# Patient Record
Sex: Male | Born: 1935 | Race: White | Hispanic: No | Marital: Married | Smoking: Never smoker
Health system: Southern US, Community
[De-identification: ages and names within clinical notes are randomized; demographics above are authoritative.]

## PROBLEM LIST (undated history)

## (undated) DIAGNOSIS — K529 Noninfective gastroenteritis and colitis, unspecified: Secondary | ICD-10-CM

## (undated) DIAGNOSIS — K5792 Diverticulitis of intestine, part unspecified, without perforation or abscess without bleeding: Secondary | ICD-10-CM

## (undated) DIAGNOSIS — K649 Unspecified hemorrhoids: Secondary | ICD-10-CM

## (undated) DIAGNOSIS — D126 Benign neoplasm of colon, unspecified: Secondary | ICD-10-CM

## (undated) DIAGNOSIS — M1990 Unspecified osteoarthritis, unspecified site: Secondary | ICD-10-CM

## (undated) DIAGNOSIS — K4090 Unilateral inguinal hernia, without obstruction or gangrene, not specified as recurrent: Secondary | ICD-10-CM

## (undated) DIAGNOSIS — E785 Hyperlipidemia, unspecified: Secondary | ICD-10-CM

## (undated) DIAGNOSIS — G309 Alzheimer's disease, unspecified: Secondary | ICD-10-CM

## (undated) DIAGNOSIS — K625 Hemorrhage of anus and rectum: Secondary | ICD-10-CM

## (undated) DIAGNOSIS — I10 Essential (primary) hypertension: Secondary | ICD-10-CM

## (undated) DIAGNOSIS — I4891 Unspecified atrial fibrillation: Secondary | ICD-10-CM

## (undated) DIAGNOSIS — C61 Malignant neoplasm of prostate: Secondary | ICD-10-CM

## (undated) DIAGNOSIS — N200 Calculus of kidney: Secondary | ICD-10-CM

## (undated) DIAGNOSIS — F0280 Dementia in other diseases classified elsewhere without behavioral disturbance: Secondary | ICD-10-CM

## (undated) DIAGNOSIS — K429 Umbilical hernia without obstruction or gangrene: Secondary | ICD-10-CM

## (undated) HISTORY — PX: APPENDECTOMY: SHX54

## (undated) HISTORY — PX: OTHER SURGICAL HISTORY: SHX169

## (undated) HISTORY — PX: ROTATOR CUFF REPAIR: SHX139

## (undated) HISTORY — PX: REPLACEMENT TOTAL KNEE BILATERAL: SUR1225

## (undated) HISTORY — PX: INSERTION PROSTATE RADIATION SEED: SUR718

## (undated) HISTORY — PX: JOINT REPLACEMENT: SHX530

## (undated) HISTORY — PX: UMBILICAL HERNIA REPAIR: SHX2598

---

## 1999-10-02 ENCOUNTER — Ambulatory Visit (HOSPITAL_COMMUNITY): Admission: RE | Admit: 1999-10-02 | Discharge: 1999-10-02 | Payer: Self-pay | Admitting: Cardiology

## 2000-03-09 ENCOUNTER — Emergency Department (HOSPITAL_COMMUNITY): Admission: EM | Admit: 2000-03-09 | Discharge: 2000-03-09 | Payer: Self-pay | Admitting: Emergency Medicine

## 2000-03-09 ENCOUNTER — Encounter: Payer: Self-pay | Admitting: Emergency Medicine

## 2001-03-13 ENCOUNTER — Encounter: Admission: RE | Admit: 2001-03-13 | Discharge: 2001-06-11 | Payer: Self-pay | Admitting: Radiation Oncology

## 2001-05-09 ENCOUNTER — Ambulatory Visit (HOSPITAL_BASED_OUTPATIENT_CLINIC_OR_DEPARTMENT_OTHER): Admission: RE | Admit: 2001-05-09 | Discharge: 2001-05-09 | Payer: Self-pay | Admitting: Urology

## 2001-05-09 ENCOUNTER — Encounter: Payer: Self-pay | Admitting: Urology

## 2001-07-24 ENCOUNTER — Ambulatory Visit: Admission: RE | Admit: 2001-07-24 | Discharge: 2001-10-22 | Payer: Self-pay | Admitting: Radiation Oncology

## 2002-04-21 ENCOUNTER — Encounter: Admission: RE | Admit: 2002-04-21 | Discharge: 2002-04-21 | Payer: Self-pay | Admitting: Urology

## 2002-04-21 ENCOUNTER — Encounter: Payer: Self-pay | Admitting: Urology

## 2003-06-24 ENCOUNTER — Ambulatory Visit (HOSPITAL_BASED_OUTPATIENT_CLINIC_OR_DEPARTMENT_OTHER): Admission: RE | Admit: 2003-06-24 | Discharge: 2003-06-25 | Payer: Self-pay | Admitting: Specialist

## 2004-09-22 ENCOUNTER — Ambulatory Visit (HOSPITAL_COMMUNITY): Admission: RE | Admit: 2004-09-22 | Discharge: 2004-09-22 | Payer: Self-pay | Admitting: Urology

## 2004-09-22 ENCOUNTER — Ambulatory Visit (HOSPITAL_BASED_OUTPATIENT_CLINIC_OR_DEPARTMENT_OTHER): Admission: RE | Admit: 2004-09-22 | Discharge: 2004-09-22 | Payer: Self-pay | Admitting: Urology

## 2004-10-04 ENCOUNTER — Ambulatory Visit (HOSPITAL_BASED_OUTPATIENT_CLINIC_OR_DEPARTMENT_OTHER): Admission: RE | Admit: 2004-10-04 | Discharge: 2004-10-04 | Payer: Self-pay | Admitting: Urology

## 2007-05-12 ENCOUNTER — Encounter: Admission: RE | Admit: 2007-05-12 | Discharge: 2007-05-12 | Payer: Self-pay | Admitting: Specialist

## 2007-06-25 ENCOUNTER — Emergency Department (HOSPITAL_COMMUNITY): Admission: EM | Admit: 2007-06-25 | Discharge: 2007-06-25 | Payer: Self-pay | Admitting: Emergency Medicine

## 2007-07-09 ENCOUNTER — Encounter
Admission: RE | Admit: 2007-07-09 | Discharge: 2007-07-09 | Payer: Self-pay | Admitting: Physical Medicine and Rehabilitation

## 2007-08-19 ENCOUNTER — Encounter: Admission: RE | Admit: 2007-08-19 | Discharge: 2007-08-19 | Payer: Self-pay | Admitting: Vascular Surgery

## 2007-08-19 ENCOUNTER — Ambulatory Visit: Payer: Self-pay | Admitting: Vascular Surgery

## 2007-09-02 ENCOUNTER — Ambulatory Visit: Payer: Self-pay | Admitting: Internal Medicine

## 2007-10-07 ENCOUNTER — Ambulatory Visit: Payer: Self-pay | Admitting: Internal Medicine

## 2007-10-07 ENCOUNTER — Encounter: Payer: Self-pay | Admitting: Internal Medicine

## 2008-02-29 ENCOUNTER — Emergency Department (HOSPITAL_COMMUNITY): Admission: EM | Admit: 2008-02-29 | Discharge: 2008-02-29 | Payer: Self-pay | Admitting: Emergency Medicine

## 2008-03-11 DIAGNOSIS — I10 Essential (primary) hypertension: Secondary | ICD-10-CM | POA: Insufficient documentation

## 2008-03-11 DIAGNOSIS — F0280 Dementia in other diseases classified elsewhere without behavioral disturbance: Secondary | ICD-10-CM

## 2008-03-11 DIAGNOSIS — K625 Hemorrhage of anus and rectum: Secondary | ICD-10-CM

## 2008-03-11 DIAGNOSIS — K649 Unspecified hemorrhoids: Secondary | ICD-10-CM | POA: Insufficient documentation

## 2008-03-11 DIAGNOSIS — N200 Calculus of kidney: Secondary | ICD-10-CM

## 2008-03-11 DIAGNOSIS — D126 Benign neoplasm of colon, unspecified: Secondary | ICD-10-CM

## 2008-03-11 DIAGNOSIS — K5730 Diverticulosis of large intestine without perforation or abscess without bleeding: Secondary | ICD-10-CM | POA: Insufficient documentation

## 2008-03-11 DIAGNOSIS — E785 Hyperlipidemia, unspecified: Secondary | ICD-10-CM

## 2008-03-11 DIAGNOSIS — K429 Umbilical hernia without obstruction or gangrene: Secondary | ICD-10-CM | POA: Insufficient documentation

## 2008-03-11 DIAGNOSIS — K4090 Unilateral inguinal hernia, without obstruction or gangrene, not specified as recurrent: Secondary | ICD-10-CM | POA: Insufficient documentation

## 2008-03-11 DIAGNOSIS — K5289 Other specified noninfective gastroenteritis and colitis: Secondary | ICD-10-CM

## 2008-03-11 DIAGNOSIS — I4891 Unspecified atrial fibrillation: Secondary | ICD-10-CM

## 2008-03-11 DIAGNOSIS — M129 Arthropathy, unspecified: Secondary | ICD-10-CM | POA: Insufficient documentation

## 2008-03-11 DIAGNOSIS — G309 Alzheimer's disease, unspecified: Secondary | ICD-10-CM

## 2009-08-16 ENCOUNTER — Ambulatory Visit: Payer: Self-pay | Admitting: Vascular Surgery

## 2010-08-09 ENCOUNTER — Encounter: Payer: Self-pay | Admitting: Internal Medicine

## 2011-01-14 ENCOUNTER — Encounter: Payer: Self-pay | Admitting: Vascular Surgery

## 2011-01-25 NOTE — Letter (Signed)
Summary: Colonoscopy-Changed to Office Visit Letter  [facility]  [address] [place], [place] [address]   Phone: [phone]  Fax: [phone]      August 09, 2010 MRN: [id number]   [facility] [address] [place], [place]  [address]   Dear Mr. Sedalia Steven [name],   According to our records, it is time for you to schedule a Colonoscopy. However, after reviewing your medical record, I feel that an office visit would be most appropriate to more completely evaluate you and determine your need for a repeat procedure.  Please call [phone] (option #2) at your convenience to schedule an office visit. If you have any questions, concerns, or feel that this letter is in error, we would appreciate your call.   Sincerely,  Wilhemina Bonito. Marina Goodell, M.D.  [facility] [phone]

## 2011-05-08 NOTE — Assessment & Plan Note (Signed)
OFFICE VISIT   [name], Steven W  DOB:  03-Dec-1928                                       08/19/2007  ZOXWR#:[id number]   The patient returns for followup regarding his small abdominal aortic  aneurysm which was discovered by Dr. Darvin Neighbours a few years ago when the  patient had a kidney stone problem.  At that time his aneurysm measured  4.5 cm in maximum diameter.  A duplex scan in the office last year  revealed it to be only 3.6 cm.  Today, a CT scan was obtained which  revealed the diameter to be 3.6 cm in the infrarenal aorta.  He has not  had any new abdominal or back symptoms.  He denies any hemiparesis,  aphasia, amaurosis fugax, diplopia, blurred vision or syncope.  He also  does not have claudication symptoms in his legs, but is limited in  walking because of bad knees and previous knee replacement surgery  bilaterally.  He denies any cardiac symptoms such as chest pain or  dyspnea on exertion.  He does take chronic Coumadin for atrial  fibrillation.   PHYSICAL EXAMINATION:  Blood pressure 138/93, heart rate is 92,  respirations are 18.  His carotid pulses are 3+ with no audible bruits.  Chest was clear to auscultation.  His abdomen is obese with no palpable  masses noted.  He has 3+ femoral and dorsalis pedis pulses palpable  bilaterally.   I reviewed the CT scan today and discussed the findings with him.  We  will follow this aneurysm every 2 years because of its small size, and  on the next followup he will get a duplex scan to look at this.   Quita Skye Hart Rochester, M.D.  Electronically Signed   JDL/MEDQ  D:  08/19/2007  T:  08/20/2007  Job:  [id number]   cc:   Geoffry Paradise, M.D.  Ronald L. Earlene Plater, M.D.

## 2011-05-08 NOTE — Assessment & Plan Note (Signed)
OFFICE VISIT   [name], Steven W  DOB:  1928-09-13                                       08/16/2009  EAVWU#:[id number]   The patient returns today for followup regarding his infrarenal  abdominal aortic aneurysm and also for evaluation of right leg edema and  varicosities.  The aneurysm has been followed by me and was last seen 2  years ago and initially thought to be 4.5 cm by CT scan but smaller than  that on our studies.  Repeat duplex scan today reveals the aneurysm to  only be 3.6 x 3.6 cm in maximum diameter.  He has had no abdominal or  back symptoms.  He does, however, have severe swelling in his right leg  below the knee and his wife has noted significant darkening of the skin  in the lower third of the right leg.  He has had painful varicosities in  the right calf for many years and he has been having increasing swelling  below the knee as well as the darkening of the skin.  There is no  history of stasis ulcers, bleeding or other complications.  He does not  wear elastic compression stockings on a regular basis nor elevate the  leg.  He cannot take ibuprofen because he is on Coumadin for atrial  fibrillation.  He has no symptoms in the contralateral left leg.   PHYSICAL EXAM:  His right lower extremity has severe venous  insufficiency with prominent varicosities in the distal thigh and the  medial calf over the greater saphenous system with 4-5 cm of discrepancy  in the circumference of the right calf on the right compared to the  left.  He has hyperpigmentation in the lower third of the right leg with  no active ulcers but thickening of the skin and one to two plus edema in  the ankle.  Left leg is free of edema.   Venous duplex exam reveals some reflux in the deep system of the right  leg as well as no evidence of any deep venous obstruction.  He does have  severe reflux at the right saphenofemoral junction throughout the right  great saphenous  vein which is 1 cm in size down to the knee level.   I think the significant amount of the edema is originating from the  gross reflux in the right great saphenous vein and I have fit him for 20  - 30 mm gradient elastic compression stockings (long-leg) to wear as  well as instructed him in elevation of the leg and taking Tylenol for  pain.  He will return in 3 months and if there has been no improvement I  think he would benefit from laser ablation of his right great saphenous  vein with multiple stab phlebectomies in the right calf.  He will return  in 3 months for followup.  He will also return in 2 years for protocol  followup of his abdominal aortic aneurysm.   Quita Skye Hart Rochester, M.D.  Electronically Signed   JDL/MEDQ  D:  08/16/2009  T:  08/17/2009  Job:  [id number]

## 2011-05-08 NOTE — Procedures (Signed)
DUPLEX ULTRASOUND OF ABDOMINAL AORTA   INDICATION:  Followup abdominal aortic aneurysm.   HISTORY:  Diabetes:  no  Cardiac:  Atrial fibrillation  Hypertension:  yes  Smoking:  no  Connective Tissue Disorder:  Family History:  no  Previous Surgery:  no   DUPLEX EXAM:         AP (cm)                   TRANSVERSE (cm)  Proximal             2.83 cm                   2.44 cm  Mid                  3.59 cm                   3.65 cm  Distal               3.14 cm                   3.35 cm  Right Iliac          1.35 cm                     cm  Left Iliac           1.29 cm                     cm   PREVIOUS:  Date: 08/19/2007 (CT)  AP:  3.6  TRANSVERSE:   IMPRESSION:  Stable abdominal aortic aneurysm measuring 3.59 cm X 3.65  cm.   ___________________________________________  Quita Skye Hart Rochester, M.D.   AS/MEDQ  D:  08/16/2009  T:  08/16/2009  Job:  [id number]

## 2011-05-08 NOTE — Assessment & Plan Note (Signed)
[name]                         GASTROENTEROLOGY OFFICE NOTE   NAME:Steven [name], Steven [name]                        MRN:          [id number]  DATE:09/02/2007                            DOB:          Mar 20, 1928    REASON FOR EVALUATION:  Surveillance colonoscopy.   HISTORY:  This is a 77 year old white male with a history of  hypertension, chronic atrial fibrillation, for which he is on Coumadin,  hyperlipidemia, prostate cancer, status post seed implantation,  radiation proctitis, kidney stones, and adenomatous colon polyps.  Patient was evaluated in May, 2005 for rectal bleeding.  A complete  colonoscopy on June 06, 2004 revealed radiation proctitis, internal  hemorrhoids, and a 10 mm pedunculated transverse colon polyp, which was  removed and found to be an adenoma.  He also had left-sided  diverticulosis.  Followup in three years recommended.  Patient reports  that his interval medical health has been stable.  He is accompanied  today by his wife, who corroborates such.  He has rarely had problems  with rectal bleeding.  No interval cardiac issues.  Recent evaluation of  a small known abdominal aortic aneurysm is also stable at 3.6 cm in  maximal diameter.   PAST MEDICAL HISTORY:  As above.  1. Prostate seed implantation.  2. Inguinal hernia repair.  3. Hemorrhoidectomy.  4. Appendectomy.  5. Bilateral knee replacement.  6. Mastoid surgery.  7. Umbilical hernia repair.   ALLERGIES:  No known drug allergies.   CURRENT MEDICATIONS:  1. Atenolol 50 mg daily.  2. Lanoxin 0.25 mg daily.  3. Aricept 10 mg daily.  4. Mobic 7.5 mg daily.  5. Multivitamin.  6. Coumadin 5 mg five days per week, 2.5 mg one day per week.  7. Calcium with vitamin D.  8. Namenda.  9. Simvastatin 80 mg daily.   FAMILY HISTORY:  Mother with colon polyps.   SOCIAL HISTORY:  Married with three children.  A master's degree.  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   Does not smoke.  Does use  alcohol.   REVIEW OF SYSTEMS:  Negative.   PHYSICAL EXAMINATION:  A well-appearing male in no acute distress.  Blood pressure is 120/80.  Heart rate 72.  Weight is 287.6 pounds.  He  is 6 feet 1 inches in height.  HEENT:  Sclerae are anicteric.  Conjunctivae are pink.  Oral mucosa  intact.  No adenopathy.  LUNGS:  Clear.  HEART:  Irregularly irregular.  ABDOMEN:  Soft without tenderness, mass, or hernia.  A large midline  incision with a big scar.  EXTREMITIES:  Without edema.   IMPRESSION:  1. This is a 77 year old gentleman with multiple medical problems, who      presents regarding surveillance colonoscopy.  Despite his advanced      age and medical problems, he is clinically stable and active.  He      is an appropriate candidate for surveillance without      contraindication.  The biggest issue is management of his      anticoagulation.  As previous, we will recommend  holding Coumadin      three days prior to the exam.  At that time, I will ask him to      start a baby aspirin for overlap purposes.  The nature of      colonoscopy as well as the risks, benefits and alternatives were      reviewed in detail.  He understood and agreed to proceed.  2. History of radiate proctitis.  3. Multiple general medical problems.     Wilhemina Bonito. Marina Goodell, MD  Electronically Signed    JNP/MedQ  DD: 09/02/2007  DT: 09/03/2007  Job #: [id number]   cc:   Geoffry Paradise, M.D.

## 2011-05-08 NOTE — Procedures (Signed)
LOWER EXTREMITY VENOUS REFLUX EXAM   INDICATION:  Right lower extremity varicose veins with pain.   EXAM:  Using color-flow imaging and pulse Doppler spectral analysis, the  right common femoral, superficial femoral, popliteal, posterior tibial,  greater and lesser saphenous veins are evaluated.  There is evidence  suggesting deep venous insufficiency in the right lower extremity.   The right saphenofemoral junction is not competent.  The right GSV is  not competent with the caliber as described below.   The right proximal short saphenous vein demonstrates competency.   GSV Diameter (used if found to be incompetent only)                                            Right    Left  Proximal Greater Saphenous Vein           1.11 cm  cm  Proximal-to-mid-thigh                     cm       cm  Mid thigh                                 1.02 cm  cm  Mid-distal thigh                          cm       cm  Distal thigh                              0.93 cm  cm  Knee                                      1.23 cm  cm   IMPRESSION:  1. Right greater saphenous vein reflux is identified with the caliber      ranging from 0.93 cm to 1.23 cm knee to groin.  2. The right greater saphenous vein is not aneurysmal.  3. The right greater saphenous vein is not tortuous.  4. The deep venous system is not competent.  5. The right lesser saphenous vein is competent.  6. 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       ___________________________________________  Steven [name], M.D.   AS/MEDQ  D:  08/16/2009  T:  08/16/2009  Job:  [id number]

## 2011-05-11 NOTE — Op Note (Signed)
NAME:  Steven [name], Steven [name]                           ACCOUNT NO.:  [id number]   MEDICAL RECORD NO.:  [id number]                   PATIENT TYPE:  AMB   LOCATION:  DSC                                  FACILITY:  [facility]   PHYSICIAN:  Erasmo Leventhal, M.D.         DATE OF BIRTH:  23-Mar-1928   DATE OF PROCEDURE:  06/24/2003  DATE OF DISCHARGE:                                 OPERATIVE REPORT   PREOPERATIVE DIAGNOSES:  Right shoulder rotator cuff tear, chronic  impingement syndrome with symptomatic degenerative acromioclavicular joint.   POSTOPERATIVE DIAGNOSES:  1. Right shoulder degenerative tearing of glenoid labrum.  2. Full-thickness rotator cuff tear.  3. Chronic rotator cuff impingement syndrome.  4. Symptomatic degenerative acromioclavicular joint.   PROCEDURES:  1. Right shoulder arthroscopic evaluation of glenohumeral joint with intra-     articular debridement of labral tears.  2. Arthroscopic subacromial decompression.  3. Arthroscopic distal clavicle resection, Mumford procedure.  4. Mini-open rotator cuff repair.   SURGEON:  Erasmo Leventhal, M.D.   ASSISTANT:  Jaquelyn Bitter. Chabon, P.A.-C.   ANESTHESIA:  Preoperative interscalene block, general.   ESTIMATED BLOOD LOSS:  Less than 10 mL.   DRAINS:  None.   COMPLICATIONS:  None.   DISPOSITION:  To PACU stable.   OPERATIVE DETAILS:  The patient and family were counseled in the holding  area.  The correct side was identified.  IV was started.  Taken to the OR,  placed in the supine position under general anesthesia and placed into a  modified beach chair position.  IV Ancef was given 1 g, as he has a history  of PENICILLIN allergy, only a rash.  He had no problems with IV Ancef during  the surgical procedure.   He was prepped with Duraprep and all was draped in a sterile fashion.  A  standard posterior portal was created and arthroscope placed through the  glenohumeral joint.  Diagnostic arthroscopy revealed  the articular cartilage  to be healthy, the shoulder was stable.  The biceps was intact, but there  were several degenerative tears of the glenoid labrum, which were unstable.  The rotator cuff showed a 2 cm full-thickness tear.  The anterolateral  portal was created staying proximal to the axillary nerve.  A motorized  shaver was then introduced through this tear into the shoulder joint itself  and the labral tears were debrided intra-articular back to a healthy rim.   Arthroscope now placed through the subacromial region, where a very thick  subacromial bursa was encountered.  Utilizing the shaver, a subacromial  bursectomy was performed.  Rotator cuff edges were then freshened back to  healthy tissue and mobilized appropriately.  The rotator cuff insertion site  was then prepared with a bur down to bleeding bone.   The Arthrex system was utilized to release the periosteum and the CA  ligament.  Hemostasis was obtained.  The bur was then  placed posteriorly and  an anterior inferior acromioplasty was performed converting him to a type 1  acromion morphology.   Attention directed to the distal clavicle.  The [facility] joint was osteoarthritic.  An accessory anterior portal was made.  At this time a bur was then placed  and the lateral 1.5 cm of the clavicle was removed circumferentially,  leaving the superior and posterior capsule intact.  The clavicle was  palpated and found to be stable.  Arthroscopic debris was removed and a  meticulous hemostasis was obtained.   At this time arthroscopic equipment was removed.  The previous superior  lateral portal was then extended anterior and posterior in the skin and  subcutaneous tissue.  The veins were electrocoagulated.  The deltoid was  split for a total of 2.5 cm, protecting the axillary nerve.  It was not  detached.  It was open, and then the rotator cuff was repaired back to its  insertion site with an Arthrex bioabsorbable anchor placed into  the anatomic  insertion site.  This gave an excellent repair.  The arm was put through a  range of motion.  The rotator cuff was nicely repaired.  There was no undue  tension, these sutures were not catching, and it was repaired in an  excellent fashion.  All wounds were copiously irrigated at this point in  time, and meticulous hemostasis was performed.   The deltoid fascia was closed with Vicryl, subcu Vicryl, portals and skin  closed with nylon suture.  After conferring with anesthesia, another 9 mL of  0.25% Marcaine was placed in the wound edges for pain control.  A sterile  compressive dressing was applied to the shoulder.  He was then awakened and  he was taken from the operating room to the PACU in stable condition in a  shoulder abduction pillow.  There were no complications.  Sponge and needle  count were correct.   PLAN:  He will be kept overnight for observation.                                               Erasmo Leventhal, M.D.    RAC/MEDQ  D:  06/24/2003  T:  06/25/2003  Job:  [id number]   cc:   Cassell Clement, M.D.  [address], [address]  [place]  [place] [address]  Fax: [phone]

## 2011-05-11 NOTE — Consult Note (Signed)
[place]  Patient:    Steven [name], Steven [name]                        MRN: [id number] Proc. Date: 03/09/00 Adm. Date:  [id number] Attending:  Otilio Connors Iv CC:         Dr. Byrd Hesselbach, [facility], Dept. of Genera             Richard A. Jacky Kindle, M.D.                          Consultation Report  REFERRED BY:  Marnee Guarneri, M.D.  REASON FOR CONSULTATION:  Postoperative abdominal wound problems.  HISTORY OF PRESENT ILLNESS:  Mr. Bradish is a 77 year old male who is status post repair of recurrent ventral incisional hernia with mesh February 26, 2000, at [facility] by Dr. Byrd Hesselbach.  He was discharged to home on March 04, 2000, and did well until yesterday when he began having fevers to 101 degrees.  Today, he  noted redness around the incision and began having bleeding.  He called the [facility], and they gave him the choice of coming in there, although because he was bleeding, they thought it might be important for him to be seen at the closest facility which is [facility].  Since he has been here, he has had a dressing placed on the wound, and the bleeding has stopped.  PAST MEDICAL HISTORY: 1. Atrial fibrillation. 2. Hypercholesterolemia. 3. Recurrent ventral incisional hernia. 4. Degenerative joint disease.  PREVIOUS OPERATIONS:  Bilateral knee replacement; exploratory laparotomy for trauma as a child; repair of ventral hernia x 3.  ALLERGIES:  None known.  MEDICATIONS:  Digoxin, atenolol, Coumadin.  SOCIAL HISTORY:  He denies tobacco use.  He is married.  He occasionally has an  alcoholic beverage.  REVIEW OF SYSTEMS:  He denies any current cough, nausea, or vomiting.  PHYSICAL EXAMINATION:  GENERAL:  He is a well-developed, well-nourished male in no acute distress.  VITAL SIGNS:  On admission to the emergency department, he was afebrile, but he  feels  febrile now.  Blood pressure 102/54, pulse 88.  ABDOMEN:  Soft, slightly distended.  There is a midline scare and peri-incisional erythema at the mid portion with purulent drainage coming from the wound.  LABORATORY DATA:  White blood cell count is 17,800.  INR 2.5, PTT 48.  CT scan demonstrates an abdominal wall fluid collection with some pockets of air in it.  There are also some lesions in his kidney that are ill defined.  IMPRESSION:  Postoperative deep wound infection status post recurrent ventral hernia with mesh.  Apparently he states it was a complex repair, and I do not have the records from [facility].  He also has chronic atrial fibrillation and is on Coumadin therapy for that.  Finally, there are seborrheic lesions in is kidneys noted on CT scan, and it has been suggested to have an ultrasound performed in followup.  PLAN:  Transfer to [facility] where Dr. Beverely Pace has accepted him for Dr. Byrd Hesselbach.  IV Rocephin 1 g was already given.  He is agreeable to this. I did tell him that he may have to have further evaluation of his kidneys by ultrasound. DD:  03/09/00 TD:  03/10/00 Job: [id number]

## 2011-05-11 NOTE — Op Note (Signed)
[place]  Patient:    Steven [name], Steven [name]                        MRN: [id number] Proc. Date: 05/09/01 Adm. Date:  [id number] Disc. Date: [id number] Attending:  Nelma Rothman Iii                           Operative Report  PREOPERATIVE DIAGNOSIS:  Adenocarcinoma of the prostate.  PROCEDURE:  Transperineal implantation of iodine-125 seeds of the prostate and flexible cystourethroscopy.  SURGEON:  Lucrezia Starch. Ovidio Hanger, M.D.  ASSISTANT:  Wynn Banker, M.D.  ANESTHESIA:  General endotracheal anesthesia.  ESTIMATED BLOOD LOSS:  15 cc.  TUBE:  16 French Foley.  COMPLICATIONS:  None.  A total of 85 seeds were implanted with 23 needles at 0.450 millicuries per seed.  INDICATIONS:  Mr. Steven [name] is a very nice 77 year old white male who was found to have an elevated PSA and subsequently underwent transrectal ultrasound and biopsy which revealed a Vanessa Ralphs Score 6 which was 3+3 adenocarcinoma from the left apex, an area of PIN located in the left midprostate.  He has considered all options.  After understanding the risks, benefits, and alternatives, he has elected to proceed with seed implantation.  He has been properly simulated and properly informed.  DESCRIPTION OF PROCEDURE:  The patient was placed in the supine position. After proper general endotracheal anesthesia, he was placed in the dorsal lithotomy position and prepped and draped with Betadine in sterile fashion. A 16 French Foley catheter was inserted, inflated with 10 cc of contrast solution.  He was prepped after the transrectal ultrasound probe had been placed on the [facility] stabilization and localized preplanned coordinates at a reference plane of 2 cm from the base.  The physical and electronic grid were placed.  Two needles were placed in unused coordinates to facilitate three-point fixation and serial implantation of the prostate was performed.  A total of 85 seeds were  implanted with 23 needles at 0.450 millicuries per seed. Vicryl strands were utilized and they were placed in preplanned coordinates.  Seed implantation was facilitated utilizing both ultrasound and fluoroscopic simultaneously.  Following implantation we were comfortable with seed location, the transrectal ultrasound probe and a 16 French red rubber catheter which had been placed in the rectum to prevent flatus was removed. The wound was dressed sterilely.  The patient was placed in the supine position.  The Foley catheter was removed and scanned for seeds and there were none within it.  Flexocystourethroscopy was then performed with an Olympus flexible cystourethroscope.  He was noted to have mild trilobar hypertrophy, one trabeculation was noted.  The efflux of clear urine was noted from the normally placed ureteral orifices bilaterally and there were no seeds in the bladder or in the urethra.  The flexible cystourethroscope was visually removed. A fresh 16 French Foley catheter was inserted and the patient was taken to the recovery room stable. DD:  05/09/01 TD:  05/12/01 Job: [id number]

## 2011-05-11 NOTE — Op Note (Signed)
NAME:  Steven [name], Steven [name]                 ACCOUNT NO.:  [id number]   MEDICAL RECORD NO.:  [id number]          PATIENT TYPE:  AMB   LOCATION:  [facility]                         FACILITY:  [facility]   PHYSICIAN:  Ronald L. Earlene Plater, M.D.  DATE OF BIRTH:  09/17/1928   DATE OF PROCEDURE:  09/22/2004  DATE OF DISCHARGE:                                 OPERATIVE REPORT   PREOPERATIVE DIAGNOSIS:  Right urolithiasis.   POSTOPERATIVE DIAGNOSIS:  Right urolithiasis with right ureteral stricture.   PROCEDURE:  Cystoscopy with water dilatation of right ureteral stricture,  placement of right double J stent.   SURGEON:  Lucrezia Starch. Earlene Plater, M.D.   ANESTHESIA:  LMA.   ESTIMATED BLOOD LOSS:  Negligible.   TUBES:  A 26-cm 6-French Cook double J pigtail stent.   COMPLICATIONS:  No complications.   CONDITION:  To recovery room stable.   INDICATIONS FOR PROCEDURE:  [name] is a 77 year old pleasant white male who  presented originally with hematuria and flank pain x2. Subsequently with CT  scan he was found to have two 4.5-cm abdominal aortic aneurysm, 2-mm  nonobstructing stone of the left kidney, bilateral simple renal cysts, and  very minimally obstructing 3 to 4-mm stone within the right ureter at the  level of the sacrum. He was further more followed up by Dr. Hart Rochester who  thought he was stable to undergo procedure today, and the aneurysm is  stable. He did stop a Coumadin a week ago, and PT is normal.   PROCEDURE IN DETAIL:  The patient was placed in the supine position with  proper LMA anesthesia, placed in the dorsal lithotomy position, prepped and  draped with Betadine in a sterile fashion. Cystourethroscopy was performed  with a 22.5-French Olympus cystoscope. Bladder was carefully inspected and  noted to have Grade 1 to 2 trabeculation, mild trilobular hypertrophy, and  ureteral orifices were normal location, and clear urine was noted  bilaterally. Under direction vision and fluoroscopic guidance,  0.038-French  Teflon-coated guide wire was placed into the right renal pelvis. Then a  balloon dilator was placed into the right ureter, a  5-cm 4-mm balloon  ureteral balloon dilator, dilated initially to 10 atmospheres pressure and  then was redilated due to wasting from a ureteral stricture below the stone.  Maximum dilatation was 14 atmospheres of pressure. Despite these procedures,  ureteral stricture remained firm. It was felt at this time it was not safe  to proceed with ureteroscopy. Therefore, double J stent was placed in the  right ureter with visualization of curling in both right renal pelvis and  bladder. Placing a stent  would allow the ureter to autodilate, and we would repeat this procedure in  10 days with ureteroscopy at that point. The location of the stent was  confirmed fluoroscopy, bladder was drained, and endoscope was removed, and  patient was taken to recovery room stable.      JL/MEDQ  D:  09/22/2004  T:  09/22/2004  Job:  [id number]

## 2011-05-11 NOTE — Op Note (Signed)
NAME:  Steven [name], Steven [name]                 ACCOUNT NO.:  [id number]   MEDICAL RECORD NO.:  [id number]          PATIENT TYPE:  AMB   LOCATION:  [facility]                         FACILITY:  [facility]   PHYSICIAN:  Ronald L. Earlene Plater, M.D.  DATE OF BIRTH:  December 06, 1928   DATE OF PROCEDURE:  10/04/2004  DATE OF DISCHARGE:                                 OPERATIVE REPORT   PREOPERATIVE DIAGNOSIS:  Right distal ureteral stone.   POSTOPERATIVE DIAGNOSIS:  Right distal ureteral stone.   PROCEDURE PERFORMED:  1.  Cystourethroscopy.  2.  Removal of right double-J ureteral stent.  3.  Ureteroscopy.  4.  Ureteroscopic laser lithotripsy.  5.  Ureteroscopic stone removal.  6.  Placement of a right 6 x 26 double-J ureteral stent.   ATTENDING SURGEON:  Darvin Neighbours, MD   ASSISTANT:  Rhae Lerner, MD   ANESTHESIA:  General endotracheal anesthesia.   COMPLICATIONS:  None.   INDICATION FOR PROCEDURE:  [name] is a 77 year old white male, who has a  history of a right distal ureteral stone, status post placement of a double-  J ureteral stent.  [name] presents today for removal of his stone.   PROCEDURE IN DETAIL:  The patient was brought to the operating room.  Following induction of general endotracheal anesthesia, was placed in dorsal  lithotomy position and prepped and draped in the usual sterile fashion.  A  rigid cystoscope was subsequently introduced through the patient's urethra  and into the bladder.  Upon entering the bladder, the distal tip of the  previously placed right double-J ureteral stent was identified and  apprehended with flexible grasping forceps.  The stent was subsequently  pulled out through the urethra until the distal tip of the stent was present  at the urethral meatus.  A guidewire was subsequently passed through the  stent and up into the right renal pelvis under fluoroscopic guidance.  The  old ureteral stent was subsequently removed over the wire.  A semi-rigid  ureteroscope was then advanced through the patient's urethra and into the  bladder.  The ureteroscope was subsequently used to intubate the right  ureteral orifice and was advanced up into the distal half of the ureter at  which point the previously noted ureteral stone was identified.  An initial  attempt was made to remove the stone by apprehending it with a nitinol  basket and removing it intact.  The stone was, however, too large to pass  easily through the intramural ureter.  The stone was subsequently released  from the nitinol basket and the holmium laser used to fragment the stone  into multiple pieces.  These pieces were then subsequently removed using the  nitinol basket.  Once all stone fragments had been removed from the ureter,  a 6 x 26 double-J ureteral stent was placed back up into the right ureter  and its correct positioning confirmed fluoroscopically.  The tether was left  attached to the distal tip of the stent and brought through the patient's  urethra.  The remaining stone fragments within the bladder  were subsequently  irrigated out, bladder emptied, and the cystoscope  removed.  The tether was then taped to the glans penis, and the case was  ended.  The patient tolerated the procedure well, and there were no  complications.  Please note that Dr. Darvin Neighbours was present for the entire  case and participated in all aspects of the procedure.      JP/MEDQ  D:  10/04/2004  T:  10/04/2004  Job:  [id number]

## 2011-07-23 ENCOUNTER — Encounter (INDEPENDENT_AMBULATORY_CARE_PROVIDER_SITE_OTHER): Payer: Medicare Other

## 2011-07-23 DIAGNOSIS — I803 Phlebitis and thrombophlebitis of lower extremities, unspecified: Secondary | ICD-10-CM

## 2011-08-01 NOTE — Procedures (Unsigned)
DUPLEX DEEP VENOUS EXAM - LOWER EXTREMITY  INDICATION:  Rule out deep venous thrombosis, right lower extremity phlebitis.  HISTORY:  Edema:  Chronic right lower extremity swelling. Trauma/Surgery:  History of bilateral knee surgeries. Pain:  No. PE:  No. Previous DVT:  No. Anticoagulants:  Coumadin. Other:  DUPLEX EXAM:               CFV   SFV   PopV  PTV    GSV               R  L  R  L  R  L  R   L  R  L Thrombosis    o  o  o     o            o Spontaneous   +  +  +     +            + Phasic        +  +  +     +            + Augmentation  +  +  +     +            + Compressible  +  +  +     +            o Competent     o  o  o     o            o  Legend:  + - yes  o - no  p - partial  D - decreased  IMPRESSION: 1. No evidence of acute deep venous or superficial venous thrombosis     noted in the right lower extremity; however, the veins of the right     calf level were unable to be adequately visualized due to patient     body habitus and edema. 2. There is residual evidence of an old thrombus noted in the right     proximal thigh great saphenous vein. 3. Incidental finding:  Reflux of >500 milliseconds noted throughout     the right femoral-popliteal venous system and great saphenous vein.  Preliminary findings stating the patency and compressibility of the right femoral-popliteal venous system were called to Fannie Knee Drinkard on 07/23/2011 at 2:10 p.m.   _____________________________ Di Kindle. Edilia Bo, M.D.  CH/MEDQ  D:  07/25/2011  T:  07/25/2011  Job:  [id number]

## 2011-10-09 LAB — URINALYSIS, ROUTINE W REFLEX MICROSCOPIC
Glucose, UA: NEGATIVE
Hgb urine dipstick: NEGATIVE
Specific Gravity, Urine: 1.013
Urobilinogen, UA: 1

## 2012-08-22 ENCOUNTER — Ambulatory Visit: Payer: Medicare Other | Attending: Internal Medicine | Admitting: *Deleted

## 2012-08-22 DIAGNOSIS — R269 Unspecified abnormalities of gait and mobility: Secondary | ICD-10-CM | POA: Insufficient documentation

## 2012-08-22 DIAGNOSIS — IMO0001 Reserved for inherently not codable concepts without codable children: Secondary | ICD-10-CM | POA: Insufficient documentation

## 2012-09-01 ENCOUNTER — Ambulatory Visit: Payer: Medicare Other | Attending: Internal Medicine | Admitting: Physical Therapy

## 2012-09-01 DIAGNOSIS — IMO0001 Reserved for inherently not codable concepts without codable children: Secondary | ICD-10-CM | POA: Insufficient documentation

## 2012-09-01 DIAGNOSIS — R269 Unspecified abnormalities of gait and mobility: Secondary | ICD-10-CM | POA: Insufficient documentation

## 2012-09-05 ENCOUNTER — Ambulatory Visit: Payer: Medicare Other | Admitting: *Deleted

## 2012-09-08 ENCOUNTER — Ambulatory Visit: Payer: Medicare Other | Admitting: *Deleted

## 2012-09-10 ENCOUNTER — Ambulatory Visit: Payer: Medicare Other | Admitting: Physical Therapy

## 2012-09-11 ENCOUNTER — Encounter: Payer: Self-pay | Admitting: Internal Medicine

## 2012-09-16 ENCOUNTER — Encounter: Payer: Self-pay | Admitting: Internal Medicine

## 2012-09-22 ENCOUNTER — Ambulatory Visit: Payer: Medicare Other | Admitting: Physical Therapy

## 2012-09-24 ENCOUNTER — Ambulatory Visit: Payer: Medicare Other | Attending: Internal Medicine | Admitting: Physical Therapy

## 2012-09-24 DIAGNOSIS — R269 Unspecified abnormalities of gait and mobility: Secondary | ICD-10-CM | POA: Insufficient documentation

## 2012-09-24 DIAGNOSIS — IMO0001 Reserved for inherently not codable concepts without codable children: Secondary | ICD-10-CM | POA: Insufficient documentation

## 2012-09-29 ENCOUNTER — Ambulatory Visit: Payer: Medicare Other | Admitting: Physical Therapy

## 2012-10-01 ENCOUNTER — Ambulatory Visit: Payer: Medicare Other | Admitting: Physical Therapy

## 2014-02-24 ENCOUNTER — Other Ambulatory Visit: Payer: Self-pay | Admitting: *Deleted

## 2014-02-24 DIAGNOSIS — I714 Abdominal aortic aneurysm, without rupture, unspecified: Secondary | ICD-10-CM

## 2014-03-11 ENCOUNTER — Emergency Department (HOSPITAL_COMMUNITY): Payer: Medicare Other

## 2014-03-11 ENCOUNTER — Encounter (HOSPITAL_COMMUNITY): Payer: Self-pay | Admitting: Emergency Medicine

## 2014-03-11 ENCOUNTER — Emergency Department (HOSPITAL_COMMUNITY)
Admission: EM | Admit: 2014-03-11 | Discharge: 2014-03-11 | Disposition: A | Discharge status: Home / Self Care | Payer: Medicare Other | Attending: Emergency Medicine | Admitting: Emergency Medicine

## 2014-03-11 DIAGNOSIS — I10 Essential (primary) hypertension: Secondary | ICD-10-CM | POA: Insufficient documentation

## 2014-03-11 DIAGNOSIS — Z8739 Personal history of other diseases of the musculoskeletal system and connective tissue: Secondary | ICD-10-CM | POA: Insufficient documentation

## 2014-03-11 DIAGNOSIS — Z8546 Personal history of malignant neoplasm of prostate: Secondary | ICD-10-CM | POA: Insufficient documentation

## 2014-03-11 DIAGNOSIS — G309 Alzheimer's disease, unspecified: Secondary | ICD-10-CM | POA: Insufficient documentation

## 2014-03-11 DIAGNOSIS — IMO0002 Reserved for concepts with insufficient information to code with codable children: Secondary | ICD-10-CM | POA: Insufficient documentation

## 2014-03-11 DIAGNOSIS — W1809XA Striking against other object with subsequent fall, initial encounter: Secondary | ICD-10-CM | POA: Insufficient documentation

## 2014-03-11 DIAGNOSIS — I4891 Unspecified atrial fibrillation: Secondary | ICD-10-CM | POA: Insufficient documentation

## 2014-03-11 DIAGNOSIS — F0280 Dementia in other diseases classified elsewhere without behavioral disturbance: Secondary | ICD-10-CM | POA: Insufficient documentation

## 2014-03-11 DIAGNOSIS — Y929 Unspecified place or not applicable: Secondary | ICD-10-CM | POA: Insufficient documentation

## 2014-03-11 DIAGNOSIS — Y9389 Activity, other specified: Secondary | ICD-10-CM | POA: Insufficient documentation

## 2014-03-11 DIAGNOSIS — Z860100 Personal history of colon polyps, unspecified: Secondary | ICD-10-CM | POA: Insufficient documentation

## 2014-03-11 DIAGNOSIS — R011 Cardiac murmur, unspecified: Secondary | ICD-10-CM | POA: Insufficient documentation

## 2014-03-11 DIAGNOSIS — Z79899 Other long term (current) drug therapy: Secondary | ICD-10-CM | POA: Insufficient documentation

## 2014-03-11 DIAGNOSIS — E785 Hyperlipidemia, unspecified: Secondary | ICD-10-CM | POA: Insufficient documentation

## 2014-03-11 DIAGNOSIS — W19XXXA Unspecified fall, initial encounter: Secondary | ICD-10-CM

## 2014-03-11 DIAGNOSIS — Z791 Long term (current) use of non-steroidal anti-inflammatories (NSAID): Secondary | ICD-10-CM | POA: Insufficient documentation

## 2014-03-11 DIAGNOSIS — S0990XA Unspecified injury of head, initial encounter: Secondary | ICD-10-CM | POA: Insufficient documentation

## 2014-03-11 DIAGNOSIS — Z87442 Personal history of urinary calculi: Secondary | ICD-10-CM | POA: Insufficient documentation

## 2014-03-11 DIAGNOSIS — Z8719 Personal history of other diseases of the digestive system: Secondary | ICD-10-CM | POA: Insufficient documentation

## 2014-03-11 DIAGNOSIS — Z7901 Long term (current) use of anticoagulants: Secondary | ICD-10-CM | POA: Insufficient documentation

## 2014-03-11 HISTORY — DX: Malignant neoplasm of prostate: C61

## 2014-03-11 HISTORY — DX: Benign neoplasm of colon, unspecified: D12.6

## 2014-03-11 HISTORY — DX: Unspecified osteoarthritis, unspecified site: M19.90

## 2014-03-11 HISTORY — DX: Umbilical hernia without obstruction or gangrene: K42.9

## 2014-03-11 HISTORY — DX: Unspecified atrial fibrillation: I48.91

## 2014-03-11 HISTORY — DX: Alzheimer's disease, unspecified: G30.9

## 2014-03-11 HISTORY — DX: Dementia in other diseases classified elsewhere, unspecified severity, without behavioral disturbance, psychotic disturbance, mood disturbance, and anxiety: F02.80

## 2014-03-11 HISTORY — DX: Unilateral inguinal hernia, without obstruction or gangrene, not specified as recurrent: K40.90

## 2014-03-11 HISTORY — DX: Hemorrhage of anus and rectum: K62.5

## 2014-03-11 HISTORY — DX: Essential (primary) hypertension: I10

## 2014-03-11 HISTORY — DX: Calculus of kidney: N20.0

## 2014-03-11 HISTORY — DX: Diverticulitis of intestine, part unspecified, without perforation or abscess without bleeding: K57.92

## 2014-03-11 HISTORY — DX: Hyperlipidemia, unspecified: E78.5

## 2014-03-11 HISTORY — DX: Unspecified hemorrhoids: K64.9

## 2014-03-11 HISTORY — DX: Noninfective gastroenteritis and colitis, unspecified: K52.9

## 2014-03-11 LAB — CBC WITH DIFFERENTIAL/PLATELET
BASOS PCT: 0 % (ref 0–1)
Basophils Absolute: 0 10*3/uL (ref 0.0–0.1)
EOS ABS: 0.3 10*3/uL (ref 0.0–0.7)
Eosinophils Relative: 4 % (ref 0–5)
HEMATOCRIT: 44.3 % (ref 39.0–52.0)
HEMOGLOBIN: 15.3 g/dL (ref 13.0–17.0)
Lymphocytes Relative: 16 % (ref 12–46)
Lymphs Abs: 1.2 10*3/uL (ref 0.7–4.0)
MCH: 33.4 pg (ref 26.0–34.0)
MCHC: 34.5 g/dL (ref 30.0–36.0)
MCV: 96.7 fL (ref 78.0–100.0)
MONO ABS: 0.7 10*3/uL (ref 0.1–1.0)
MONOS PCT: 10 % (ref 3–12)
Neutro Abs: 4.9 10*3/uL (ref 1.7–7.7)
Neutrophils Relative %: 69 % (ref 43–77)
Platelets: 120 10*3/uL — ABNORMAL LOW (ref 150–400)
RBC: 4.58 MIL/uL (ref 4.22–5.81)
RDW: 13.5 % (ref 11.5–15.5)
WBC: 7.1 10*3/uL (ref 4.0–10.5)

## 2014-03-11 LAB — BASIC METABOLIC PANEL
BUN: 22 mg/dL (ref 6–23)
CALCIUM: 9.5 mg/dL (ref 8.4–10.5)
CO2: 26 mEq/L (ref 19–32)
CREATININE: 1.01 mg/dL (ref 0.50–1.35)
Chloride: 105 mEq/L (ref 96–112)
GFR, EST AFRICAN AMERICAN: 77 mL/min — AB (ref >90)
GFR, EST NON AFRICAN AMERICAN: 66 mL/min — AB (ref >90)
Glucose, Bld: 95 mg/dL (ref 70–99)
Potassium: 4.6 mEq/L (ref 3.7–5.3)
Sodium: 142 mEq/L (ref 137–147)

## 2014-03-11 LAB — PROTIME-INR
INR: 2.95 — ABNORMAL HIGH (ref 0.00–1.49)
Prothrombin Time: 29.7 seconds — ABNORMAL HIGH (ref 11.6–15.2)

## 2014-03-11 NOTE — ED Provider Notes (Signed)
CSN: [id number]     Arrival date & time 03/11/14  0718 History   First MD Initiated Contact with Patient 03/11/14 0725     Chief Complaint  Patient presents with  . Fall     (Consider location/radiation/quality/duration/timing/severity/associated sxs/prior Treatment) HPI Comments: Patient with history of dementia, atrial fibrillation on Coumadin -- presents with complaint of fall. Patient awoke this morning to go to the bathroom and had a fall. He struck his head on the door of the bathroom. Patient's wife responded immediately and patient was awake and alert. He has not had syncope in the past. Wife thinks that she tripped on a plastic which was lying on the floor. Patient denies neck pain or back pain. He denies hip pain or pain in his extremities. Patient ambulates with a walker but has not walked since the fall. The onset of this condition was acute. The course is constant. Aggravating factors: none. Alleviating factors: none. Level V caveat 2/2 dementia.    Patient is a 80 y.o. male presenting with fall. The history is provided by the patient and the spouse.  Fall    Past Medical History  Diagnosis Date  . A-fib   . Hypertension   . Hyperlipidemia   . Adenomatous colon polyp   . Hemorrhoids   . Inguinal hernia   . Umbilical hernia   . Colitis   . Diverticulitis   . Rectal bleeding   . Arthritis   . Alzheimer disease   . Nephrolithiasis   . Prostate cancer    Past Surgical History  Procedure Laterality Date  . Appendectomy    . Joint replacement    . Replacement total knee bilateral Bilateral   . Umbilical hernia repair    . Infected mesh after umbilical hernia repair    . Rotator cuff repair Right   . Insertion prostate radiation seed     No family history on file. History  Substance Use Topics  . Smoking status: Never Smoker   . Smokeless tobacco: Not on file  . Alcohol Use: Yes    Review of Systems  Unable to perform ROS: Dementia      Allergies   Erythromycin  Home Medications   Current Outpatient Rx  Name  Route  Sig  Dispense  Refill  . atenolol (TENORMIN) 25 MG tablet   Oral   Take 25 mg by mouth daily.         Marland Kitchen DIGOXIN PO   Oral   Take 1 tablet by mouth daily.         . Furosemide (LASIX PO)   Oral   Take 1 tablet by mouth as needed (leg swelling).         Marland Kitchen GALANTAMINE HYDROBROMIDE PO   Oral   Take 1 tablet by mouth daily.         . meloxicam (MOBIC) 7.5 MG tablet   Oral   Take 7.5 mg by mouth daily.         . memantine (NAMENDA) 10 MG tablet   Oral   Take 10 mg by mouth 2 (two) times daily.         . Multiple Vitamins-Minerals (MULTIVITAMIN WITH MINERALS) tablet   Oral   Take 1 tablet by mouth daily.         . simvastatin (ZOCOR) 80 MG tablet   Oral   Take 80 mg by mouth daily at 6 PM.         . warfarin (COUMADIN)  5 MG tablet   Oral   Take 5 mg by mouth one time only at 6 PM.          BP 150/64  Pulse 63  Temp(Src) 97.6 F (36.4 C) (Oral)  Resp 16  SpO2 96%  Physical Exam  Nursing note and vitals reviewed. Constitutional: He appears well-developed and well-nourished.  HENT:  Head: Normocephalic. Head is without raccoon's eyes and without Battle's sign.  Right Ear: Tympanic membrane, external ear and ear canal normal. No hemotympanum.  Left Ear: Tympanic membrane, external ear and ear canal normal. No hemotympanum.  Nose: Nose normal. No nasal septal hematoma.  Mouth/Throat: Oropharynx is clear and moist.  Few small abrasions on forehead  Eyes: Conjunctivae, EOM and lids are normal. Pupils are equal, round, and reactive to light. Right eye exhibits no discharge. Left eye exhibits no discharge.  No visible hyphema  Neck: Normal range of motion. Neck supple.  Cardiovascular: Normal rate.  An irregularly irregular rhythm present.  Murmur heard.  Systolic murmur is present with a grade of 3/6  Pulmonary/Chest: Effort normal and breath sounds normal.  Abdominal: Soft.  There is no tenderness.  Musculoskeletal: Normal range of motion.       Cervical back: He exhibits normal range of motion, no tenderness and no bony tenderness.       Thoracic back: He exhibits no tenderness and no bony tenderness.       Lumbar back: He exhibits no tenderness and no bony tenderness.  Neurological: He is alert. He has normal strength. No cranial nerve deficit or sensory deficit. Coordination normal. GCS eye subscore is 4. GCS verbal subscore is 5. GCS motor subscore is 6.  Skin: Skin is warm and dry.  Psychiatric: He has a normal mood and affect.    ED Course  Procedures (including critical care time) Labs Review Labs Reviewed  CBC WITH DIFFERENTIAL - Abnormal; Notable for the following:    Platelets 120 (*)    All other components within normal limits  BASIC METABOLIC PANEL - Abnormal; Notable for the following:    GFR calc non Af Amer 66 (*)    GFR calc Af Amer 77 (*)    All other components within normal limits  PROTIME-INR - Abnormal; Notable for the following:    Prothrombin Time 29.7 (*)    INR 2.95 (*)    All other components within normal limits   Imaging Review Ct Cervical Spine Wo Contrast  03/11/2014   CLINICAL DATA:  Pain post trauma  EXAM: CT HEAD WITHOUT CONTRAST  CT CERVICAL SPINE WITHOUT CONTRAST  TECHNIQUE: Multidetector CT imaging of the head and cervical spine was performed following the standard protocol without intravenous contrast. Multiplanar CT image reconstructions of the cervical spine were also generated.  COMPARISON:  None.  FINDINGS: CT HEAD FINDINGS  There is extensive generalized atrophy bilaterally. There is no appreciable mass, hemorrhage, extra-axial fluid collection, or midline shift. There is increased attenuation in both middle cerebral arteries in a symmetric manner. This finding is probably due to vessel calcification and is of questionable clinical significance in the acute setting. There is mild patchy small vessel disease in the  centra semiovale bilaterally. No acute appearing infarct is seen.  The bony calvarium appears intact. Mastoid air cells are clear. There is extensive opacification of the right maxillary antrum. There is a small retention cyst in the inferior left maxillary antrum.  CT CERVICAL SPINE FINDINGS  There is no appreciable fracture or spondylolisthesis. Prevertebral  soft tissues and predental space regions are normal.  There is moderately severe disc space narrowing at C4-5, C6-7, and C7-T1. There is extensive facet hypertrophy at multiple levels with exit foraminal narrowing at multiple levels. Exit foraminal narrowing is most severe at C3-4 on the left, C4-5 on the left, C5-6 on the right, and C6-7 on the left due to bony hypertrophy. There is no frank disc extrusion or stenosis. There are no paraspinous lesions. There is calcification in both carotid arteries.  IMPRESSION: CT head: Extensive atrophy with patchy periventricular small vessel disease. No intracranial mass, hemorrhage, or extra-axial fluid. No acute appearing infarct apparent. There is extensive right maxillary sinus disease.  CT cervical spine: Extensive spondylosis and osteoarthritic change. No fracture or spondylolisthesis. Extensive atherosclerotic change.   Electronically Signed   By: Lowella Grip M.D.   On: 03/11/2014 09:24     EKG Interpretation None      7:42 AM Patient seen and examined. Work-up initiated. Medications ordered. D/w Dr. Darl Householder who will see.   Vital signs reviewed and are as follows: Filed Vitals:   03/11/14 0726  BP: 150/64  Pulse: 63  Temp: 97.6 F (36.4 C)  Resp: 16   Patient seen by Dr. Darl Householder.   Imaging negative for acute problems. Patient and wife informed.   Family counseled on rare complication of delayed bleed. Patient was counseled on head injury precautions and symptoms that should indicate their return to the ED. These include severe worsening headache, vision changes, confusion, loss of  consciousness, trouble walking, nausea & vomiting, or weakness/tingling in extremities.     MDM   Final diagnoses:  Head injury  Fall   Patients with head injury after a suspected mechanical fall. INR therapeutic. Imaging negative for acute injury. Patient is at his baseline. He is cared for at home with family. Patient appears well, nontoxic. Appropriate return instructions given. No concern for hip or other extremity injury.    Carlisle Cater, PA-C 03/11/14 1056

## 2014-03-11 NOTE — ED Notes (Signed)
Pt d/c'd from monitor, continuous pulse oximetry and blood pressure cuff; family at bedside helping pt to get dressed

## 2014-03-11 NOTE — ED Notes (Signed)
Josh, PA at the bedside  

## 2014-03-11 NOTE — ED Notes (Signed)
Given urinal

## 2014-03-11 NOTE — ED Notes (Signed)
Pt undressed, in gown, on monitor, continuous pulse oximetry and blood pressure cuff; family at bedside 

## 2014-03-11 NOTE — ED Notes (Signed)
Dr. Yao at the bedside. 

## 2014-03-11 NOTE — ED Notes (Signed)
Per [facility], pt was up to the bathroom and tripped on plastic from remodeling bathroom. Has a hole in the door from his head hitting it. Pt is on coumadin from Afib with rate of 70 and irreg. Pt has a pea size amt of blood on bottom when transferring from EMS to our stretcher. CBG of 104 and BP of 174/80 and 97 on RA. Denies any HA, neck or back pain, blurred vision. C/A x 3. Pt is baseline.

## 2014-03-11 NOTE — Discharge Instructions (Signed)
Please read and follow all provided instructions.  Your diagnoses today include:  1. Head injury   2. Fall    Tests performed today include:  CT scan of your head and neck that did not show any serious injury or bleeding.  Blood counts - normal  Coumadin level - 2.9  Vital signs. See below for your results today.   Medications prescribed:   None  Take any prescribed medications only as directed.  Home care instructions:  Follow any educational materials contained in this packet.  Follow-up instructions: Please follow-up with your primary care provider in the next 3 days for further evaluation of your symptoms. If you do not have a primary care doctor -- see below for referral information.   Return instructions:  SEEK IMMEDIATE MEDICAL ATTENTION IF:  There is confusion or drowsiness (although children frequently become drowsy after injury).   You cannot awaken the injured person.   You have more than one episode of vomiting.   You notice dizziness or unsteadiness which is getting worse, or inability to walk.   You have convulsions or unconsciousness.   You experience severe, persistent headaches not relieved by Tylenol.  You cannot use arms or legs normally.   There are changes in pupil sizes. (This is the black center in the colored part of the eye)   There is clear or bloody discharge from the nose or ears.   You have change in speech, vision, swallowing, or understanding.   Localized weakness, numbness, tingling, or change in bowel or bladder control.  You have any other emergent concerns.  Additional Information: You have had a head injury which does not appear to require admission at this time.  Your vital signs today were: BP 134/60   Pulse 56   Temp(Src) 97.6 F (36.4 C) (Oral)   Resp 21   SpO2 94% If your blood pressure (BP) was elevated above 135/85 this visit, please have this repeated by your doctor within one month. --------------

## 2014-03-12 NOTE — ED Provider Notes (Signed)
Medical screening examination/treatment/procedure(s) were conducted as a shared visit with non-physician practitioner(s) and myself.  I personally evaluated the patient during the encounter.   EKG Interpretation None      Steven [name] is a 80 y.o. male hx of afib on coumadin here with fall. Mechanical fall this AM and hit his head. Denies back or neck pain. Has small abrasion on forehead, tetanus up to date. INR 2.9. CT showed no bleed. Stable for d/c.    Wandra Arthurs, MD 03/12/14 [phone]

## 2014-05-10 ENCOUNTER — Encounter: Payer: Self-pay | Admitting: Vascular Surgery

## 2014-05-11 ENCOUNTER — Ambulatory Visit (HOSPITAL_COMMUNITY)
Admission: RE | Admit: 2014-05-11 | Discharge: 2014-05-11 | Disposition: A | Discharge status: Home / Self Care | Payer: Medicare Other | Source: Ambulatory Visit | Attending: Vascular Surgery | Admitting: Vascular Surgery

## 2014-05-11 ENCOUNTER — Ambulatory Visit (INDEPENDENT_AMBULATORY_CARE_PROVIDER_SITE_OTHER): Payer: Medicare Other | Admitting: Vascular Surgery

## 2014-05-11 ENCOUNTER — Encounter: Payer: Self-pay | Admitting: Vascular Surgery

## 2014-05-11 VITALS — BP 119/78 | HR 67 | Resp 16 | Ht 74.0 in | Wt 259.0 lb

## 2014-05-11 DIAGNOSIS — I714 Abdominal aortic aneurysm, without rupture, unspecified: Secondary | ICD-10-CM | POA: Insufficient documentation

## 2014-05-11 NOTE — Progress Notes (Signed)
Subjective:     Patient ID: Steven [name], male   DOB: [birth year], 80 y.o.   MRN: [id number]  HPI this 80 year old male with Alzheimer's dementia returns today for followup regarding his abdominal aortic aneurysm which was discovered in 2008. That time it was 3.6 cm in maximum diameter. We have not seen him since 2010. Day he had an ultrasound performed which reveals aneurysm have a maximum diameter 4.7 cm. His Alzheimer's has slowly progressed over the past 12 years.  Past Medical History  Diagnosis Date  . A-fib   . Hypertension   . Hyperlipidemia   . Adenomatous colon polyp   . Hemorrhoids   . Inguinal hernia   . Umbilical hernia   . Colitis   . Diverticulitis   . Rectal bleeding   . Arthritis   . Alzheimer disease   . Nephrolithiasis   . Prostate cancer     History  Substance Use Topics  . Smoking status: Never Smoker   . Smokeless tobacco: Never Used  . Alcohol Use: Yes    No family history on file.  Allergies  Allergen Reactions  . Erythromycin     Current outpatient prescriptions:atenolol (TENORMIN) 25 MG tablet, Take 25 mg by mouth daily., Disp: , Rfl: ;  DIGOXIN PO, Take 1 tablet by mouth daily., Disp: , Rfl: ;  Furosemide (LASIX PO), Take 1 tablet by mouth as needed (leg swelling)., Disp: , Rfl: ;  GALANTAMINE HYDROBROMIDE PO, Take 1 tablet by mouth daily., Disp: , Rfl: ;  meloxicam (MOBIC) 7.5 MG tablet, Take 7.5 mg by mouth daily., Disp: , Rfl:  memantine (NAMENDA) 10 MG tablet, Take 10 mg by mouth 2 (two) times daily., Disp: , Rfl: ;  Multiple Vitamins-Minerals (MULTIVITAMIN WITH MINERALS) tablet, Take 1 tablet by mouth daily., Disp: , Rfl: ;  simvastatin (ZOCOR) 80 MG tablet, Take 80 mg by mouth daily at 6 PM., Disp: , Rfl: ;  warfarin (COUMADIN) 5 MG tablet, Take 5 mg by mouth one time only at 6 PM., Disp: , Rfl:   BP 119/78  Pulse 67  Resp 16  Ht 6\' 2"  (1.88 m)  Wt 259 lb (117.482 kg)  BMI 33.24 kg/m2  Body mass index is 33.24  kg/(m^2).          Review of Systems ambulates short distances with unsteady gait according to family. Denies chest pain.    Objective:   Physical Exam BP 119/78  Pulse 67  Resp 16  Ht 6\' 2"  (1.88 m)  Wt 259 lb (117.482 kg)  BMI 33.24 kg/m2  Gen. alert nor any x3 Lungs no rhonchi or wheezing Abdomen soft nontender no pulsatile mass palpable Both lower extremities with mild hyperpigmentation and 1+ edema.  That were duplex scan of the abdominal aorta which reveals a maximum diameter of or 0.7 cm. Most recent study in our last was in 2010 when it was 3.65 cm.     Assessment:     Abdominal aortic aneurysm-89.31 cm in 80 year old male with Alzheimer's dementia     Plan:     Discuss situation with the family have decided to discontinue any further monitoring and followup of this aneurysm which has been fairly stable over the past 8 years since he was discovered. Family in total agreement with this and we will no longer follow him with periodic ultrasound studies which have not been performed frequently over the past several years

## 2015-09-20 ENCOUNTER — Ambulatory Visit (INDEPENDENT_AMBULATORY_CARE_PROVIDER_SITE_OTHER): Payer: Medicare Other | Admitting: Family Medicine

## 2015-09-20 VITALS — BP 120/70 | HR 82 | Temp 98.4°F | Resp 17 | Ht 73.0 in | Wt 218.0 lb

## 2015-09-20 DIAGNOSIS — G309 Alzheimer's disease, unspecified: Secondary | ICD-10-CM | POA: Diagnosis not present

## 2015-09-20 DIAGNOSIS — F0280 Dementia in other diseases classified elsewhere without behavioral disturbance: Secondary | ICD-10-CM

## 2015-09-20 DIAGNOSIS — Z111 Encounter for screening for respiratory tuberculosis: Secondary | ICD-10-CM

## 2015-09-20 NOTE — Progress Notes (Signed)

## 2015-09-20 NOTE — Progress Notes (Signed)
Patient ID: Steven [name], male    DOB: Jul 17, 1928  Age: 81 y.o. MRN: [id number]  Chief Complaint  Patient presents with  . Immunizations    TB     Subjective:   Patient is here for a TB skin test. He is going into a respite care setting. He has history of dementia.  .  Objective:  BP 120/70 mmHg  Pulse 82  Temp(Src) 98.4 F (36.9 C) (Oral)  Resp 17  Ht 6\' 1"  (1.854 m)  Wt 218 lb (98.884 kg)  BMI 28.77 kg/m2  SpO2 96%  No history of TB exposure. Not examined.  Assessment & Plan:   Assessment: No diagnosis found.    Plan: TB screening test There are no Patient Instructions on file for this visit.   No Follow-up on file.   HOPPER,DAVID, MD 09/20/2015

## 2015-09-20 NOTE — Patient Instructions (Addendum)
Return in 48-72 hours to get the test interpreted.

## 2015-09-20 NOTE — Progress Notes (Deleted)
   Subjective:    Patient ID: Steven [name], male    DOB: [birth year], 82 y.o.   MRN: [id number]  HPI    Review of Systems     Objective:   Physical Exam        Assessment & Plan:

## 2015-09-22 ENCOUNTER — Other Ambulatory Visit (HOSPITAL_COMMUNITY): Payer: Self-pay | Admitting: Internal Medicine

## 2015-09-22 DIAGNOSIS — R1314 Dysphagia, pharyngoesophageal phase: Secondary | ICD-10-CM

## 2015-09-22 DIAGNOSIS — Z111 Encounter for screening for respiratory tuberculosis: Secondary | ICD-10-CM

## 2015-09-22 LAB — TB SKIN TEST
Induration: 0 mm
TB Skin Test: NEGATIVE

## 2015-09-29 ENCOUNTER — Ambulatory Visit (HOSPITAL_COMMUNITY): Payer: BLUE CROSS/BLUE SHIELD

## 2015-09-29 ENCOUNTER — Ambulatory Visit (HOSPITAL_COMMUNITY): Payer: Medicare Other

## 2015-09-30 ENCOUNTER — Ambulatory Visit (HOSPITAL_COMMUNITY)
Admission: RE | Admit: 2015-09-30 | Discharge: 2015-09-30 | Disposition: A | Discharge status: Home / Self Care | Payer: Medicare Other | Source: Ambulatory Visit | Attending: Internal Medicine | Admitting: Internal Medicine

## 2015-09-30 DIAGNOSIS — R1312 Dysphagia, oropharyngeal phase: Secondary | ICD-10-CM | POA: Insufficient documentation

## 2015-09-30 DIAGNOSIS — R1314 Dysphagia, pharyngoesophageal phase: Secondary | ICD-10-CM

## 2015-09-30 DIAGNOSIS — R1313 Dysphagia, pharyngeal phase: Secondary | ICD-10-CM | POA: Diagnosis not present

## 2015-09-30 DIAGNOSIS — R1310 Dysphagia, unspecified: Secondary | ICD-10-CM | POA: Diagnosis present

## 2015-11-16 ENCOUNTER — Emergency Department (HOSPITAL_COMMUNITY): Payer: Medicare Other

## 2015-11-16 ENCOUNTER — Encounter (HOSPITAL_COMMUNITY): Payer: Self-pay | Admitting: Emergency Medicine

## 2015-11-16 ENCOUNTER — Emergency Department (HOSPITAL_COMMUNITY)
Admission: EM | Admit: 2015-11-16 | Discharge: 2015-11-16 | Disposition: A | Discharge status: Home / Self Care | Payer: Medicare Other | Attending: Emergency Medicine | Admitting: Emergency Medicine

## 2015-11-16 DIAGNOSIS — W1839XA Other fall on same level, initial encounter: Secondary | ICD-10-CM | POA: Diagnosis not present

## 2015-11-16 DIAGNOSIS — Y92129 Unspecified place in nursing home as the place of occurrence of the external cause: Secondary | ICD-10-CM | POA: Insufficient documentation

## 2015-11-16 DIAGNOSIS — I4891 Unspecified atrial fibrillation: Secondary | ICD-10-CM | POA: Insufficient documentation

## 2015-11-16 DIAGNOSIS — Y9389 Activity, other specified: Secondary | ICD-10-CM | POA: Diagnosis not present

## 2015-11-16 DIAGNOSIS — R011 Cardiac murmur, unspecified: Secondary | ICD-10-CM | POA: Diagnosis not present

## 2015-11-16 DIAGNOSIS — E785 Hyperlipidemia, unspecified: Secondary | ICD-10-CM | POA: Insufficient documentation

## 2015-11-16 DIAGNOSIS — Z8719 Personal history of other diseases of the digestive system: Secondary | ICD-10-CM | POA: Insufficient documentation

## 2015-11-16 DIAGNOSIS — F0280 Dementia in other diseases classified elsewhere without behavioral disturbance: Secondary | ICD-10-CM | POA: Insufficient documentation

## 2015-11-16 DIAGNOSIS — Z791 Long term (current) use of non-steroidal anti-inflammatories (NSAID): Secondary | ICD-10-CM | POA: Diagnosis not present

## 2015-11-16 DIAGNOSIS — Z87442 Personal history of urinary calculi: Secondary | ICD-10-CM | POA: Insufficient documentation

## 2015-11-16 DIAGNOSIS — Z79899 Other long term (current) drug therapy: Secondary | ICD-10-CM | POA: Diagnosis not present

## 2015-11-16 DIAGNOSIS — S022XXB Fracture of nasal bones, initial encounter for open fracture: Secondary | ICD-10-CM | POA: Insufficient documentation

## 2015-11-16 DIAGNOSIS — Z7901 Long term (current) use of anticoagulants: Secondary | ICD-10-CM | POA: Diagnosis not present

## 2015-11-16 DIAGNOSIS — W19XXXA Unspecified fall, initial encounter: Secondary | ICD-10-CM

## 2015-11-16 DIAGNOSIS — S0181XA Laceration without foreign body of other part of head, initial encounter: Secondary | ICD-10-CM

## 2015-11-16 DIAGNOSIS — Z23 Encounter for immunization: Secondary | ICD-10-CM | POA: Diagnosis not present

## 2015-11-16 DIAGNOSIS — Z86018 Personal history of other benign neoplasm: Secondary | ICD-10-CM | POA: Diagnosis not present

## 2015-11-16 DIAGNOSIS — Z8546 Personal history of malignant neoplasm of prostate: Secondary | ICD-10-CM | POA: Insufficient documentation

## 2015-11-16 DIAGNOSIS — Y998 Other external cause status: Secondary | ICD-10-CM | POA: Insufficient documentation

## 2015-11-16 DIAGNOSIS — G309 Alzheimer's disease, unspecified: Secondary | ICD-10-CM | POA: Diagnosis not present

## 2015-11-16 DIAGNOSIS — M1990 Unspecified osteoarthritis, unspecified site: Secondary | ICD-10-CM | POA: Diagnosis not present

## 2015-11-16 DIAGNOSIS — S0993XA Unspecified injury of face, initial encounter: Secondary | ICD-10-CM | POA: Diagnosis present

## 2015-11-16 DIAGNOSIS — S01501A Unspecified open wound of lip, initial encounter: Secondary | ICD-10-CM | POA: Insufficient documentation

## 2015-11-16 DIAGNOSIS — K0381 Cracked tooth: Secondary | ICD-10-CM | POA: Insufficient documentation

## 2015-11-16 LAB — I-STAT CHEM 8, ED
BUN: 17 mg/dL (ref 6–20)
CALCIUM ION: 1.21 mmol/L (ref 1.13–1.30)
CHLORIDE: 103 mmol/L (ref 101–111)
CREATININE: 1 mg/dL (ref 0.61–1.24)
Glucose, Bld: 101 mg/dL — ABNORMAL HIGH (ref 65–99)
HCT: 44 % (ref 39.0–52.0)
Hemoglobin: 15 g/dL (ref 13.0–17.0)
Potassium: 3.9 mmol/L (ref 3.5–5.1)
SODIUM: 143 mmol/L (ref 135–145)
TCO2: 28 mmol/L (ref 0–100)

## 2015-11-16 LAB — CBC WITH DIFFERENTIAL/PLATELET
Basophils Absolute: 0 10*3/uL (ref 0.0–0.1)
Basophils Relative: 0 %
EOS ABS: 0.2 10*3/uL (ref 0.0–0.7)
Eosinophils Relative: 3 %
HEMATOCRIT: 41.3 % (ref 39.0–52.0)
HEMOGLOBIN: 13.5 g/dL (ref 13.0–17.0)
LYMPHS ABS: 0.7 10*3/uL (ref 0.7–4.0)
LYMPHS PCT: 12 %
MCH: 31.8 pg (ref 26.0–34.0)
MCHC: 32.7 g/dL (ref 30.0–36.0)
MCV: 97.2 fL (ref 78.0–100.0)
MONOS PCT: 10 %
Monocytes Absolute: 0.6 10*3/uL (ref 0.1–1.0)
NEUTROS ABS: 4.5 10*3/uL (ref 1.7–7.7)
NEUTROS PCT: 75 %
Platelets: 134 10*3/uL — ABNORMAL LOW (ref 150–400)
RBC: 4.25 MIL/uL (ref 4.22–5.81)
RDW: 13.4 % (ref 11.5–15.5)
WBC: 6 10*3/uL (ref 4.0–10.5)

## 2015-11-16 LAB — URINALYSIS, ROUTINE W REFLEX MICROSCOPIC
BILIRUBIN URINE: NEGATIVE
GLUCOSE, UA: NEGATIVE mg/dL
Hgb urine dipstick: NEGATIVE
Ketones, ur: NEGATIVE mg/dL
Leukocytes, UA: NEGATIVE
NITRITE: NEGATIVE
PH: 6.5 (ref 5.0–8.0)
Protein, ur: NEGATIVE mg/dL
SPECIFIC GRAVITY, URINE: 1.021 (ref 1.005–1.030)

## 2015-11-16 MED ORDER — CEPHALEXIN 500 MG PO CAPS: 500.0000 mg | ORAL_CAPSULE | Freq: Four times a day (QID) | ORAL | Status: DC

## 2015-11-16 MED ORDER — TETANUS-DIPHTH-ACELL PERTUSSIS 5-2.5-18.5 LF-MCG/0.5 IM SUSP
0.5000 mL | Freq: Once | INTRAMUSCULAR | Status: AC
  Administered 2015-11-16: 0.5 mL via INTRAMUSCULAR
  Filled 2015-11-16: qty 0.5

## 2015-11-16 MED ORDER — ACETAMINOPHEN 500 MG PO TABS: 500.0000 mg | ORAL_TABLET | Freq: Four times a day (QID) | ORAL | Status: AC | PRN

## 2015-11-16 MED ORDER — CEPHALEXIN 500 MG PO CAPS
500.0000 mg | ORAL_CAPSULE | Freq: Once | ORAL | Status: AC
  Administered 2015-11-16: 500 mg via ORAL
  Filled 2015-11-16: qty 1

## 2015-11-16 NOTE — ED Notes (Signed)
Writer was unsuccessful attempt for blood draws, RN notified. RN will draw labs.

## 2015-11-16 NOTE — ED Notes (Signed)
PA at bedside at this time.  

## 2015-11-16 NOTE — ED Notes (Signed)
Report given to Jana Half, RN and Loyal, Therapist, sports.

## 2015-11-16 NOTE — Discharge Instructions (Signed)
Please avoid washing face for the first 18 hrs.  Monitor facial wound for signs of infection.  Allow sterile strip to fall off without removing it.  Once sterile strips has fallen off, please apply neosporin to scar twice daily for 1 week.  Take antibiotic as prescribed for broken nose.  Follow up with ENT specialist if you cannot breath through nose or having recurrent nose bleed.   Facial Laceration  A facial laceration is a cut on the face. These injuries can be painful and cause bleeding. Lacerations usually heal quickly, but they need special care to reduce scarring. DIAGNOSIS  Your health care provider will take a medical history, ask for details about how the injury occurred, and examine the wound to determine how deep the cut is. TREATMENT  Some facial lacerations may not require closure. Others may not be able to be closed because of an increased risk of infection. The risk of infection and the chance for successful closure will depend on various factors, including the amount of time since the injury occurred. The wound may be cleaned to help prevent infection. If closure is appropriate, pain medicines may be given if needed. Your health care provider will use stitches (sutures), wound glue (adhesive), or skin adhesive strips to repair the laceration. These tools bring the skin edges together to allow for faster healing and a better cosmetic outcome. If needed, you may also be given a tetanus shot. HOME CARE INSTRUCTIONS  Only take over-the-counter or prescription medicines as directed by your health care provider.  Follow your health care provider's instructions for wound care. These instructions will vary depending on the technique used for closing the wound. For Sutures:  Keep the wound clean and dry.   If you were given a bandage (dressing), you should change it at least once a day. Also change the dressing if it becomes wet or dirty, or as directed by your health care provider.    Wash the wound with soap and water 2 times a day. Rinse the wound off with water to remove all soap. Pat the wound dry with a clean towel.   After cleaning, apply a thin layer of the antibiotic ointment recommended by your health care provider. This will help prevent infection and keep the dressing from sticking.   You may shower as usual after the first 24 hours. Do not soak the wound in water until the sutures are removed.   Get your sutures removed as directed by your health care provider. With facial lacerations, sutures should usually be taken out after 4-5 days to avoid stitch marks.   Wait a few days after your sutures are removed before applying any makeup. For Skin Adhesive Strips:  Keep the wound clean and dry.   Do not get the skin adhesive strips wet. You may bathe carefully, using caution to keep the wound dry.   If the wound gets wet, pat it dry with a clean towel.   Skin adhesive strips will fall off on their own. You may trim the strips as the wound heals. Do not remove skin adhesive strips that are still stuck to the wound. They will fall off in time.  For Wound Adhesive:  You may briefly wet your wound in the shower or bath. Do not soak or scrub the wound. Do not swim. Avoid periods of heavy sweating until the skin adhesive has fallen off on its own. After showering or bathing, gently pat the wound dry with a clean towel.  Do not apply liquid medicine, cream medicine, ointment medicine, or makeup to your wound while the skin adhesive is in place. This may loosen the film before your wound is healed.   If a dressing is placed over the wound, be careful not to apply tape directly over the skin adhesive. This may cause the adhesive to be pulled off before the wound is healed.   Avoid prolonged exposure to sunlight or tanning lamps while the skin adhesive is in place.  The skin adhesive will usually remain in place for 5-10 days, then naturally fall off the  skin. Do not pick at the adhesive film.  After Healing: Once the wound has healed, cover the wound with sunscreen during the day for 1 full year. This can help minimize scarring. Exposure to ultraviolet light in the first year will darken the scar. It can take 1-2 years for the scar to lose its redness and to heal completely.  SEEK MEDICAL CARE IF:  You have a fever. SEEK IMMEDIATE MEDICAL CARE IF:  You have redness, pain, or swelling around the wound.   You see ayellowish-white fluid (pus) coming from the wound.    This information is not intended to replace advice given to you by your health care provider. Make sure you discuss any questions you have with your health care provider.   Document Released: 01/17/2005 Document Revised: 12/31/2014 Document Reviewed: 07/23/2013 [employer] Interactive Patient Education 2016 [employer]   Nasal Fracture A fracture is a break in a bone. A nasal fracture is a broken nose. Minor breaks do not need treatment. Serious breaks may need surgery. HOME CARE  If directed, put ice on the injured area:  Put ice in a plastic bag.  Place a towel between your skin and the bag.  Leave the ice on for 20 minutes, 2-3 times per day.  Take over-the-counter and prescription medicines only as told by your doctor.  If your nose bleeds, sit up while you gently squeeze your nose shut for 10 minutes.  Try to not blow your nose.  Return to your normal activities as told by your doctor. Ask your doctor what activities are safe for you.  Do not play contact sports for 3-4 weeks or as told by your doctor.  Keep all follow-up visits as told by your doctor. This is important. GET HELP IF:  You have more pain or very bad pain.  You keep having nosebleeds.  The shape of your nose does not return to normal after 5 days.  You have pus coming out of your nose. GET HELP RIGHT AWAY IF:  Your nose bleeds for more than 20 minutes.  You have clear fluid  draining out of your nose.  You have a grape-like swelling on the inside of your nose.  You have trouble moving your eyes.  You keep throwing up (vomiting).   This information is not intended to replace advice given to you by your health care provider. Make sure you discuss any questions you have with your health care provider.   Document Released: 09/18/2008 Document Revised: 08/31/2015 Document Reviewed: 01/17/2015 [employer] Interactive Patient Education [employer].

## 2015-11-16 NOTE — ED Provider Notes (Signed)
CSN: [id number]     Arrival date & time 11/16/15  0356 History   First MD Initiated Contact with Patient 11/16/15 0601     Chief Complaint  Patient presents with  . Fall     (Consider location/radiation/quality/duration/timing/severity/associated sxs/prior Treatment) HPI   81 year old male with history of A. fib currently on warfarin, prostate cancer, hypertension, Alzheimer disease brought here via EMS from [facility] for evaluation of a fall. The facility notified patient's wife at approximately 3 AM that patient was found on the ground in his room. Wife felt, patient must have fallen off from his lift chair. This is an unwitnessed fall. Patient suffer laceration across this nose and upper lip from the fall. He does complain of mild pain to his nose but no other complaint. Patient unable to remember what caused the fall. History is limited due to his dementia. He does not have history of recurrent falls when asked if he has headache, neck pain, chest pain, back pain, abdominal pain, trouble breathing, or pain to his extremities patient denies. Unsure last tetanus status.   Past Medical History  Diagnosis Date  . A-fib ([place])   . Hypertension   . Hyperlipidemia   . Adenomatous colon polyp   . Hemorrhoids   . Inguinal hernia   . Umbilical hernia   . Colitis   . Diverticulitis   . Rectal bleeding   . Arthritis   . Alzheimer disease   . Nephrolithiasis   . Prostate cancer [facility])    Past Surgical History  Procedure Laterality Date  . Appendectomy    . Joint replacement    . Replacement total knee bilateral Bilateral   . Umbilical hernia repair    . Infected mesh after umbilical hernia repair    . Rotator cuff repair Right   . Insertion prostate radiation seed     History reviewed. No pertinent family history. Social History  Substance Use Topics  . Smoking status: Never Smoker   . Smokeless tobacco: Never Used  . Alcohol Use: Yes    Review of Systems  Unable to  perform ROS: Dementia      Allergies  Erythromycin  Home Medications   Prior to Admission medications   Medication Sig Start Date End Date Taking? Authorizing Provider  atenolol (TENORMIN) 25 MG tablet Take 25 mg by mouth daily.   Yes Historical Provider, MD  DIGOXIN PO Take 0.25 mg by mouth daily.    Yes Historical Provider, MD  GALANTAMINE HYDROBROMIDE PO Take 16 mg by mouth daily.    Yes Historical Provider, MD  memantine (NAMENDA) 10 MG tablet Take 10 mg by mouth 2 (two) times daily.   Yes Historical Provider, MD  Multiple Vitamins-Minerals (MULTIVITAMIN WITH MINERALS) tablet Take 1 tablet by mouth daily.   Yes Historical Provider, MD  neomycin-bacitracin-polymyxin (NEOSPORIN) ointment Apply 1 application topically every 12 (twelve) hours. Behind the right ear   Yes Historical Provider, MD  traZODone (DESYREL) 50 MG tablet Take 50 mg by mouth at bedtime.   Yes Historical Provider, MD  warfarin (COUMADIN) 5 MG tablet Take 5 mg by mouth daily. At 5pm   Yes Historical Provider, MD  meloxicam (MOBIC) 7.5 MG tablet Take 7.5 mg by mouth daily.    Historical Provider, MD  simvastatin (ZOCOR) 80 MG tablet Take 80 mg by mouth daily at 6 PM.    Historical Provider, MD   BP 153/93 mmHg  Pulse 85  Temp(Src) 97.6 F (36.4 C) (Oral)  Resp 18  Ht 6\' 2"  (1.88 m)  Wt 99.791 kg  BMI 28.23 kg/m2  SpO2 98% Physical Exam  Constitutional: He appears well-developed and well-nourished. No distress.  Elderly Caucasian male appears stated age in no acute distress.  HENT:  Head: Normocephalic.  Skin tear noted across bridge of nose, skin tear noted to right upper lip with swelling noted. No scalp tenderness, no hemotympanum, no septal hematoma, no malocclusion, no dental pain. Multiple cracks noted throughout all teeth which appears to be chronic as patient has no pain.  Eyes: Conjunctivae are normal.  Neck: Neck supple.  No cervical midline spine tenderness crepitus or step-off.  Cardiovascular:   Irregularly irregular heart rate with systolic murmur.  Pulmonary/Chest: Effort normal and breath sounds normal.  Abdominal: Soft. There is no tenderness.  Musculoskeletal: He exhibits no tenderness.  Normal strength to all 4 extremities without focal point tenderness to the extremities.  Neurological: He is alert. He has normal strength. No cranial nerve deficit or sensory deficit. GCS eye subscore is 4. GCS verbal subscore is 5. GCS motor subscore is 6.  Skin: No rash noted.  Psychiatric: He has a normal mood and affect.  Nursing note and vitals reviewed.   ED Course  Procedures (including critical care time)  Patient here for evaluation of an unwitnessed fall at the nursing facility, memory care unit. He is currently on warfarin. Head and neck CT scan without evidence of acute fractures or dislocation. Maxillofacial CT demonstrates acute fractures of the right nasal bone with mild leftward displacement. This is considered to be an open fracture therefore patient will receive pain medication, and antibiotic. The facial laceration was cleaned and Steri-Stripped by me. Patient use a walker to ambulate and was able to ambulate with assistance. His labs were unremarkable, urine shows no signs of infection. He'll be discharge back to facility. Care instructions provided, return precaution discussed. Wife is at bedside who agrees with plan.  Care discussed with Dr. Randal Buba  Labs Review Labs Reviewed  CBC WITH DIFFERENTIAL/PLATELET - Abnormal; Notable for the following:    Platelets 134 (*)    All other components within normal limits  URINALYSIS, ROUTINE W REFLEX MICROSCOPIC (NOT AT [facility]) - Abnormal; Notable for the following:    Color, Urine AMBER (*)    All other components within normal limits  I-STAT CHEM 8, ED - Abnormal; Notable for the following:    Glucose, Bld 101 (*)    All other components within normal limits  PROTIME-INR    Imaging Review Ct Head Wo Contrast  11/16/2015   CLINICAL DATA:  Status post unwitnessed fall, with nasal pain and skin tear at the bridge of the nose. Concern for head or cervical spine injury. Initial encounter. EXAM: CT HEAD WITHOUT CONTRAST CT MAXILLOFACIAL WITHOUT CONTRAST CT CERVICAL SPINE WITHOUT CONTRAST TECHNIQUE: Multidetector CT imaging of the head, cervical spine, and maxillofacial structures were performed using the standard protocol without intravenous contrast. Multiplanar CT image reconstructions of the cervical spine and maxillofacial structures were also generated. COMPARISON:  CT of the head and cervical spine performed 03/11/2014 FINDINGS: CT HEAD FINDINGS There is no evidence of acute infarction, mass lesion, or intra- or extra-axial hemorrhage on CT. Prominence of the ventricles and sulci reflects moderately severe cortical volume loss. Cerebellar atrophy is noted. Scattered periventricular and subcortical white matter change likely reflects small vessel ischemic microangiopathy. Chronic ischemic change is noted at the basal ganglia bilaterally. The brainstem and fourth ventricle are within normal limits. The cerebral hemispheres demonstrate grossly  normal gray-white differentiation. No mass effect or midline shift is seen. There is no evidence of fracture; visualized osseous structures are unremarkable in appearance. The orbits are within normal limits. There is opacification of the right maxillary sinus. The remaining paranasal sinuses and right mastoid air cells are well-aerated. The patient is status post left-sided mastoidectomy. No significant soft tissue abnormalities are seen. CT MAXILLOFACIAL FINDINGS There is slight depression of the right side of the nasal bone, raising question for acute fracture, with mild leftward displacement. The maxilla and mandible appear intact. The visualized dentition demonstrates no acute abnormality. The orbits are intact bilaterally. There is opacification of the right maxillary sinus, and mild partial  opacification of the base of the left maxillary sinus. The patient is status post left-sided mastoidectomy. The remaining visualized paranasal sinuses and right mastoid air cells are well-aerated. Soft tissue swelling is noted overlying the right frontal calvarium. The parapharyngeal fat planes are preserved. The nasopharynx, oropharynx and hypopharynx are unremarkable in appearance. The visualized portions of the valleculae and piriform sinuses are grossly unremarkable. The parotid and submandibular glands are within normal limits. No cervical lymphadenopathy is seen. CT CERVICAL SPINE FINDINGS There is no evidence of fracture or subluxation. Scattered anterior and posterior disc osteophyte complexes are noted along the cervical spine, with mild multilevel disc space narrowing. Mild underlying facet disease is noted. Vertebral bodies demonstrate normal height and alignment. Prevertebral soft tissues are within normal limits. The visualized portions of the thyroid gland are unremarkable in appearance. The visualized lung apices are clear. Mild calcification is noted at the carotid bifurcations bilaterally. IMPRESSION: 1. No evidence of traumatic intracranial injury. 2. Slight depression of the right side of the nasal bone, raising question for acute fracture, with mild leftward displacement. 3. No evidence of fracture or subluxation along the cervical spine. 4. Soft tissue swelling overlying the right frontal calvarium. 5. Moderately severe cortical volume loss and scattered small vessel ischemic microangiopathy. 6. Chronic ischemic change at the basal ganglia bilaterally. 7. Opacification of the right maxillary sinus, and mild partial opacification of the base of the left maxillary sinus. 8. Mild degenerative change noted along the cervical spine. 9. Mild calcification at the carotid bifurcations bilaterally. Carotid ultrasound could be considered for further evaluation, when and as deemed clinically appropriate.  Electronically Signed   By: Garald Balding M.D.   On: 11/16/2015 05:43   Ct Cervical Spine Wo Contrast  11/16/2015  CLINICAL DATA:  Status post unwitnessed fall, with nasal pain and skin tear at the bridge of the nose. Concern for head or cervical spine injury. Initial encounter. EXAM: CT HEAD WITHOUT CONTRAST CT MAXILLOFACIAL WITHOUT CONTRAST CT CERVICAL SPINE WITHOUT CONTRAST TECHNIQUE: Multidetector CT imaging of the head, cervical spine, and maxillofacial structures were performed using the standard protocol without intravenous contrast. Multiplanar CT image reconstructions of the cervical spine and maxillofacial structures were also generated. COMPARISON:  CT of the head and cervical spine performed 03/11/2014 FINDINGS: CT HEAD FINDINGS There is no evidence of acute infarction, mass lesion, or intra- or extra-axial hemorrhage on CT. Prominence of the ventricles and sulci reflects moderately severe cortical volume loss. Cerebellar atrophy is noted. Scattered periventricular and subcortical white matter change likely reflects small vessel ischemic microangiopathy. Chronic ischemic change is noted at the basal ganglia bilaterally. The brainstem and fourth ventricle are within normal limits. The cerebral hemispheres demonstrate grossly normal gray-white differentiation. No mass effect or midline shift is seen. There is no evidence of fracture; visualized osseous structures are unremarkable  in appearance. The orbits are within normal limits. There is opacification of the right maxillary sinus. The remaining paranasal sinuses and right mastoid air cells are well-aerated. The patient is status post left-sided mastoidectomy. No significant soft tissue abnormalities are seen. CT MAXILLOFACIAL FINDINGS There is slight depression of the right side of the nasal bone, raising question for acute fracture, with mild leftward displacement. The maxilla and mandible appear intact. The visualized dentition demonstrates no  acute abnormality. The orbits are intact bilaterally. There is opacification of the right maxillary sinus, and mild partial opacification of the base of the left maxillary sinus. The patient is status post left-sided mastoidectomy. The remaining visualized paranasal sinuses and right mastoid air cells are well-aerated. Soft tissue swelling is noted overlying the right frontal calvarium. The parapharyngeal fat planes are preserved. The nasopharynx, oropharynx and hypopharynx are unremarkable in appearance. The visualized portions of the valleculae and piriform sinuses are grossly unremarkable. The parotid and submandibular glands are within normal limits. No cervical lymphadenopathy is seen. CT CERVICAL SPINE FINDINGS There is no evidence of fracture or subluxation. Scattered anterior and posterior disc osteophyte complexes are noted along the cervical spine, with mild multilevel disc space narrowing. Mild underlying facet disease is noted. Vertebral bodies demonstrate normal height and alignment. Prevertebral soft tissues are within normal limits. The visualized portions of the thyroid gland are unremarkable in appearance. The visualized lung apices are clear. Mild calcification is noted at the carotid bifurcations bilaterally. IMPRESSION: 1. No evidence of traumatic intracranial injury. 2. Slight depression of the right side of the nasal bone, raising question for acute fracture, with mild leftward displacement. 3. No evidence of fracture or subluxation along the cervical spine. 4. Soft tissue swelling overlying the right frontal calvarium. 5. Moderately severe cortical volume loss and scattered small vessel ischemic microangiopathy. 6. Chronic ischemic change at the basal ganglia bilaterally. 7. Opacification of the right maxillary sinus, and mild partial opacification of the base of the left maxillary sinus. 8. Mild degenerative change noted along the cervical spine. 9. Mild calcification at the carotid  bifurcations bilaterally. Carotid ultrasound could be considered for further evaluation, when and as deemed clinically appropriate. Electronically Signed   By: Garald Balding M.D.   On: 11/16/2015 05:43   Ct Maxillofacial Wo Cm  11/16/2015  CLINICAL DATA:  Status post unwitnessed fall, with nasal pain and skin tear at the bridge of the nose. Concern for head or cervical spine injury. Initial encounter. EXAM: CT HEAD WITHOUT CONTRAST CT MAXILLOFACIAL WITHOUT CONTRAST CT CERVICAL SPINE WITHOUT CONTRAST TECHNIQUE: Multidetector CT imaging of the head, cervical spine, and maxillofacial structures were performed using the standard protocol without intravenous contrast. Multiplanar CT image reconstructions of the cervical spine and maxillofacial structures were also generated. COMPARISON:  CT of the head and cervical spine performed 03/11/2014 FINDINGS: CT HEAD FINDINGS There is no evidence of acute infarction, mass lesion, or intra- or extra-axial hemorrhage on CT. Prominence of the ventricles and sulci reflects moderately severe cortical volume loss. Cerebellar atrophy is noted. Scattered periventricular and subcortical white matter change likely reflects small vessel ischemic microangiopathy. Chronic ischemic change is noted at the basal ganglia bilaterally. The brainstem and fourth ventricle are within normal limits. The cerebral hemispheres demonstrate grossly normal gray-white differentiation. No mass effect or midline shift is seen. There is no evidence of fracture; visualized osseous structures are unremarkable in appearance. The orbits are within normal limits. There is opacification of the right maxillary sinus. The remaining paranasal sinuses and right mastoid  air cells are well-aerated. The patient is status post left-sided mastoidectomy. No significant soft tissue abnormalities are seen. CT MAXILLOFACIAL FINDINGS There is slight depression of the right side of the nasal bone, raising question for acute  fracture, with mild leftward displacement. The maxilla and mandible appear intact. The visualized dentition demonstrates no acute abnormality. The orbits are intact bilaterally. There is opacification of the right maxillary sinus, and mild partial opacification of the base of the left maxillary sinus. The patient is status post left-sided mastoidectomy. The remaining visualized paranasal sinuses and right mastoid air cells are well-aerated. Soft tissue swelling is noted overlying the right frontal calvarium. The parapharyngeal fat planes are preserved. The nasopharynx, oropharynx and hypopharynx are unremarkable in appearance. The visualized portions of the valleculae and piriform sinuses are grossly unremarkable. The parotid and submandibular glands are within normal limits. No cervical lymphadenopathy is seen. CT CERVICAL SPINE FINDINGS There is no evidence of fracture or subluxation. Scattered anterior and posterior disc osteophyte complexes are noted along the cervical spine, with mild multilevel disc space narrowing. Mild underlying facet disease is noted. Vertebral bodies demonstrate normal height and alignment. Prevertebral soft tissues are within normal limits. The visualized portions of the thyroid gland are unremarkable in appearance. The visualized lung apices are clear. Mild calcification is noted at the carotid bifurcations bilaterally. IMPRESSION: 1. No evidence of traumatic intracranial injury. 2. Slight depression of the right side of the nasal bone, raising question for acute fracture, with mild leftward displacement. 3. No evidence of fracture or subluxation along the cervical spine. 4. Soft tissue swelling overlying the right frontal calvarium. 5. Moderately severe cortical volume loss and scattered small vessel ischemic microangiopathy. 6. Chronic ischemic change at the basal ganglia bilaterally. 7. Opacification of the right maxillary sinus, and mild partial opacification of the base of the left  maxillary sinus. 8. Mild degenerative change noted along the cervical spine. 9. Mild calcification at the carotid bifurcations bilaterally. Carotid ultrasound could be considered for further evaluation, when and as deemed clinically appropriate. Electronically Signed   By: Garald Balding M.D.   On: 11/16/2015 05:43   I have personally reviewed and evaluated these images and lab results as part of my medical decision-making.   EKG Interpretation None      MDM   Final diagnoses:  Fall at nursing home, initial encounter  Nasal bone fracture, open, initial encounter  Face lacerations, initial encounter    BP 145/61 mmHg  Pulse 69  Temp(Src) 97.6 F (36.4 C) (Oral)  Resp 18  Ht 6\' 2"  (1.88 m)  Wt 99.791 kg  BMI 28.23 kg/m2  SpO2 100%     Domenic Moras, PA-C 11/16/15 [id number]  April Palumbo, MD 11/16/15 2305

## 2015-11-16 NOTE — ED Notes (Signed)
Pt returned to room  

## 2015-11-16 NOTE — ED Notes (Signed)
Pt BIBA, from [facility], where pt had an unwitnessed fall. Pt was walking to BR, unsure if pt tripped and fell, but pt denies remembering the fall. Staff reports they heard him fall from across the hall, and when they arrived to his room, pt was immediately responsive. Pt only complains of pain to nose. Bloody nose noted, but bleeding controlled. Skin tear at nasal bridge.

## 2015-11-16 NOTE — ED Notes (Signed)
Pt nose irrigated. Wife at bedside.

## 2015-11-16 NOTE — ED Notes (Signed)
Patient transported to CT 

## 2015-11-16 NOTE — ED Notes (Signed)
Dermabond at bedside.  

## 2015-11-16 NOTE — ED Notes (Signed)
Bed: WA16 Expected date:  Expected time:  Means of arrival:  Comments: EMS 

## 2015-11-16 NOTE — ED Notes (Signed)
Pt ambulated in room with walker and nurse assistance

## 2015-12-02 ENCOUNTER — Encounter (HOSPITAL_COMMUNITY): Payer: Self-pay | Admitting: Emergency Medicine

## 2015-12-02 ENCOUNTER — Emergency Department (HOSPITAL_COMMUNITY): Payer: Medicare Other

## 2015-12-02 ENCOUNTER — Emergency Department (HOSPITAL_COMMUNITY)
Admission: EM | Admit: 2015-12-02 | Discharge: 2015-12-03 | Disposition: A | Discharge status: Home / Self Care | Payer: Medicare Other | Attending: Emergency Medicine | Admitting: Emergency Medicine

## 2015-12-02 DIAGNOSIS — Z86018 Personal history of other benign neoplasm: Secondary | ICD-10-CM | POA: Diagnosis not present

## 2015-12-02 DIAGNOSIS — F0280 Dementia in other diseases classified elsewhere without behavioral disturbance: Secondary | ICD-10-CM | POA: Diagnosis not present

## 2015-12-02 DIAGNOSIS — S0101XA Laceration without foreign body of scalp, initial encounter: Secondary | ICD-10-CM

## 2015-12-02 DIAGNOSIS — Z8639 Personal history of other endocrine, nutritional and metabolic disease: Secondary | ICD-10-CM | POA: Insufficient documentation

## 2015-12-02 DIAGNOSIS — Z87442 Personal history of urinary calculi: Secondary | ICD-10-CM | POA: Diagnosis not present

## 2015-12-02 DIAGNOSIS — Z8546 Personal history of malignant neoplasm of prostate: Secondary | ICD-10-CM | POA: Diagnosis not present

## 2015-12-02 DIAGNOSIS — M1990 Unspecified osteoarthritis, unspecified site: Secondary | ICD-10-CM | POA: Diagnosis not present

## 2015-12-02 DIAGNOSIS — Y9389 Activity, other specified: Secondary | ICD-10-CM | POA: Insufficient documentation

## 2015-12-02 DIAGNOSIS — Z79899 Other long term (current) drug therapy: Secondary | ICD-10-CM | POA: Insufficient documentation

## 2015-12-02 DIAGNOSIS — Y998 Other external cause status: Secondary | ICD-10-CM | POA: Insufficient documentation

## 2015-12-02 DIAGNOSIS — I10 Essential (primary) hypertension: Secondary | ICD-10-CM | POA: Diagnosis not present

## 2015-12-02 DIAGNOSIS — S0990XA Unspecified injury of head, initial encounter: Secondary | ICD-10-CM | POA: Diagnosis present

## 2015-12-02 DIAGNOSIS — W07XXXA Fall from chair, initial encounter: Secondary | ICD-10-CM | POA: Diagnosis not present

## 2015-12-02 DIAGNOSIS — Z8719 Personal history of other diseases of the digestive system: Secondary | ICD-10-CM | POA: Insufficient documentation

## 2015-12-02 DIAGNOSIS — I4891 Unspecified atrial fibrillation: Secondary | ICD-10-CM | POA: Diagnosis not present

## 2015-12-02 DIAGNOSIS — Y9289 Other specified places as the place of occurrence of the external cause: Secondary | ICD-10-CM | POA: Insufficient documentation

## 2015-12-02 DIAGNOSIS — G309 Alzheimer's disease, unspecified: Secondary | ICD-10-CM | POA: Diagnosis not present

## 2015-12-02 DIAGNOSIS — Z7901 Long term (current) use of anticoagulants: Secondary | ICD-10-CM | POA: Diagnosis not present

## 2015-12-02 LAB — PROTIME-INR
INR: 3.04 — ABNORMAL HIGH (ref 0.00–1.49)
PROTHROMBIN TIME: 30.9 s — AB (ref 11.6–15.2)

## 2015-12-02 MED ORDER — LIDOCAINE HCL 1 % IJ SOLN
INTRAMUSCULAR | Status: AC
  Filled 2015-12-02: qty 20

## 2015-12-02 NOTE — ED Provider Notes (Signed)
CSN: [id number]     Arrival date & time 12/02/15  2226 History   First MD Initiated Contact with Patient 12/02/15 2254     Chief Complaint  Patient presents with  . Fall  . Head Injury     (Consider location/radiation/quality/duration/timing/severity/associated sxs/prior Treatment) Patient is a 81 y.o. male presenting with fall and head injury. The history is provided by the spouse. The history is limited by the condition of the patient (Dementia).  Fall  Head Injury He apparently fell getting out of a chair and shuck his head on the floor of. He suffered a laceration to his scalp. He is anticoagulated on warfarin for atrial fibrillation. There is no known loss of consciousness. Mental status is at baseline.  Past Medical History  Diagnosis Date  . A-fib ([place])   . Hypertension   . Hyperlipidemia   . Adenomatous colon polyp   . Hemorrhoids   . Inguinal hernia   . Umbilical hernia   . Colitis   . Diverticulitis   . Rectal bleeding   . Arthritis   . Alzheimer disease   . Nephrolithiasis   . Prostate cancer [facility])    Past Surgical History  Procedure Laterality Date  . Appendectomy    . Joint replacement    . Replacement total knee bilateral Bilateral   . Umbilical hernia repair    . Infected mesh after umbilical hernia repair    . Rotator cuff repair Right   . Insertion prostate radiation seed     History reviewed. No pertinent family history. Social History  Substance Use Topics  . Smoking status: Never Smoker   . Smokeless tobacco: Never Used  . Alcohol Use: Yes    Review of Systems  Unable to perform ROS: Dementia      Allergies  Erythromycin  Home Medications   Prior to Admission medications   Medication Sig Start Date End Date Taking? Authorizing Provider  acetaminophen (TYLENOL) 500 MG tablet Take 1 tablet (500 mg total) by mouth every 6 (six) hours as needed for mild pain, moderate pain or headache. 11/16/15  Yes Domenic Moras, PA-C  atenolol  (TENORMIN) 25 MG tablet Take 25 mg by mouth daily.   Yes Historical Provider, MD  DIGOXIN PO Take 0.25 mg by mouth daily.    Yes Historical Provider, MD  GALANTAMINE HYDROBROMIDE PO Take 16 mg by mouth daily.    Yes Historical Provider, MD  memantine (NAMENDA) 10 MG tablet Take 10 mg by mouth daily.    Yes Historical Provider, MD  Multiple Vitamins-Minerals (MULTIVITAMIN WITH MINERALS) tablet Take 1 tablet by mouth daily.   Yes Historical Provider, MD  neomycin-bacitracin-polymyxin (NEOSPORIN) ointment Apply 1 application topically every 12 (twelve) hours. Behind the right ear   Yes Historical Provider, MD  traZODone (DESYREL) 50 MG tablet Take 50 mg by mouth at bedtime.   Yes Historical Provider, MD  warfarin (COUMADIN) 5 MG tablet Take 5 mg by mouth daily. At 5pm   Yes Historical Provider, MD  cephALEXin (KEFLEX) 500 MG capsule Take 1 capsule (500 mg total) by mouth 4 (four) times daily. Patient not taking: Reported on 12/02/2015 11/16/15   Domenic Moras, PA-C   BP 114/70 mmHg  Pulse 75  Temp(Src) 97.5 F (36.4 C) (Oral)  Resp 16  SpO2 97% Physical Exam  Nursing note and vitals reviewed.  81 year old male, resting comfortably and in no acute distress. Vital signs are normal. Oxygen saturation is 97%, which is normal. Head is normocephalic.  Laceration is present on the right frontal area. PERRLA, EOMI. Oropharynx is clear. Neck is immobilized in a stiff cervical collar and is nontender without adenopathy or JVD. Back is nontender and there is no CVA tenderness. Lungs are clear without rales, wheezes, or rhonchi. Chest is nontender. Heart has regular rate and rhythm with 3/6 harsh systolic ejection murmur heard at the cardiac base with radiation to the neck. Abdomen is soft, flat, nontender without masses or hepatosplenomegaly and peristalsis is normoactive. Extremities have 1-2+ edema, full range of motion is present. Skin is warm and dry without rash. Neurologic: He is awake and alert but  nonverbal, cranial nerves are intact, there are no motor or sensory deficits.  ED Course  Procedures (including critical care time) Labs Review Labs Reviewed  PROTIME-INR - Abnormal; Notable for the following:    Prothrombin Time 30.9 (*)    INR 3.04 (*)    All other components within normal limits    Imaging Review Ct Head Wo Contrast  12/02/2015  CLINICAL DATA:  Status post fall, with head injury. Lost balance while getting out of chair, and fell on hardwood floor. Scalp laceration at the top of the head. Patient on Coumadin. Concern for cervical spine injury. Initial encounter. EXAM: CT HEAD WITHOUT CONTRAST CT CERVICAL SPINE WITHOUT CONTRAST TECHNIQUE: Multidetector CT imaging of the head and cervical spine was performed following the standard protocol without intravenous contrast. Multiplanar CT image reconstructions of the cervical spine were also generated. COMPARISON:  CT of the head and cervical spine performed 11/16/2015 FINDINGS: CT HEAD FINDINGS There is no evidence of acute infarction, mass lesion, or intra- or extra-axial hemorrhage on CT. Prominence of the ventricles and sulci reflects moderate cortical volume loss. Cerebellar atrophy is noted. Scattered periventricular and subcortical white matter change likely reflects small vessel ischemic microangiopathy. The brainstem and fourth ventricle are within normal limits. The basal ganglia are unremarkable in appearance. The cerebral hemispheres demonstrate grossly normal gray-white differentiation. No mass effect or midline shift is seen. There is no evidence of fracture; visualized osseous structures are unremarkable in appearance. The orbits are within normal limits. There is near complete opacification of the right maxillary sinus. There is mild partial opacification of the right mastoid air cells. The remaining paranasal sinuses and left mastoid air cells are well-aerated. Soft tissue swelling and laceration are noted overlying the  right frontal calvarium. CT CERVICAL SPINE FINDINGS There is no evidence of fracture or subluxation. Vertebral body C7 is not fully imaged on this study. Facet disease is noted along the cervical spine. There is mild disc space narrowing at C4-C5 and C6-C7, and scattered anterior and posterior disc osteophyte complexes are noted along the cervical spine. Vertebral bodies demonstrate normal height and alignment. Prevertebral soft tissues are within normal limits. Scattered calcification is noted at the carotid bifurcations bilaterally. IMPRESSION: 1. No evidence of traumatic intracranial injury or fracture. 2. No evidence of fracture or subluxation along the cervical spine. 3. Soft tissue swelling and laceration overlying the right frontal calvarium. 4. Moderate cortical volume loss and scattered small vessel ischemic microangiopathy. 5. Mild degenerative change noted along the cervical spine. 6. Near complete opacification of the right maxillary sinus, and mild partial opacification of the right mastoid air cells. 7. Scattered calcification at the carotid bifurcations bilaterally. Carotid ultrasound could be considered for further evaluation, when and as deemed clinically appropriate. Electronically Signed   By: Garald Balding M.D.   On: 12/02/2015 23:45   Ct Cervical Spine Wo Contrast  12/02/2015  CLINICAL DATA:  Status post fall, with head injury. Lost balance while getting out of chair, and fell on hardwood floor. Scalp laceration at the top of the head. Patient on Coumadin. Concern for cervical spine injury. Initial encounter. EXAM: CT HEAD WITHOUT CONTRAST CT CERVICAL SPINE WITHOUT CONTRAST TECHNIQUE: Multidetector CT imaging of the head and cervical spine was performed following the standard protocol without intravenous contrast. Multiplanar CT image reconstructions of the cervical spine were also generated. COMPARISON:  CT of the head and cervical spine performed 11/16/2015 FINDINGS: CT HEAD FINDINGS There  is no evidence of acute infarction, mass lesion, or intra- or extra-axial hemorrhage on CT. Prominence of the ventricles and sulci reflects moderate cortical volume loss. Cerebellar atrophy is noted. Scattered periventricular and subcortical white matter change likely reflects small vessel ischemic microangiopathy. The brainstem and fourth ventricle are within normal limits. The basal ganglia are unremarkable in appearance. The cerebral hemispheres demonstrate grossly normal gray-white differentiation. No mass effect or midline shift is seen. There is no evidence of fracture; visualized osseous structures are unremarkable in appearance. The orbits are within normal limits. There is near complete opacification of the right maxillary sinus. There is mild partial opacification of the right mastoid air cells. The remaining paranasal sinuses and left mastoid air cells are well-aerated. Soft tissue swelling and laceration are noted overlying the right frontal calvarium. CT CERVICAL SPINE FINDINGS There is no evidence of fracture or subluxation. Vertebral body C7 is not fully imaged on this study. Facet disease is noted along the cervical spine. There is mild disc space narrowing at C4-C5 and C6-C7, and scattered anterior and posterior disc osteophyte complexes are noted along the cervical spine. Vertebral bodies demonstrate normal height and alignment. Prevertebral soft tissues are within normal limits. Scattered calcification is noted at the carotid bifurcations bilaterally. IMPRESSION: 1. No evidence of traumatic intracranial injury or fracture. 2. No evidence of fracture or subluxation along the cervical spine. 3. Soft tissue swelling and laceration overlying the right frontal calvarium. 4. Moderate cortical volume loss and scattered small vessel ischemic microangiopathy. 5. Mild degenerative change noted along the cervical spine. 6. Near complete opacification of the right maxillary sinus, and mild partial  opacification of the right mastoid air cells. 7. Scattered calcification at the carotid bifurcations bilaterally. Carotid ultrasound could be considered for further evaluation, when and as deemed clinically appropriate. Electronically Signed   By: Garald Balding M.D.   On: 12/02/2015 23:45   I have personally reviewed and evaluated these images and lab results as part of my medical decision-making.  LACERATION REPAIR Performed by: [id number] Authorized by: [id number] Consent: Verbal consent obtained. Risks and benefits: risks, benefits and alternatives were discussed Consent given by: patient Patient identity confirmed: provided demographic data Prepped and Draped in normal sterile fashion Wound explored  Laceration Location: Scalp  Laceration Length: 3 cm  No Foreign Bodies seen or palpated  Anesthesia: None   Amount of cleaning: standard  Skin closure: Close   Technique: Tissue adhesive   Patient tolerance: Patient tolerated the procedure well with no immediate complications.  MDM   Final diagnoses:  Fall from chair, initial encounter  Scalp laceration, initial encounter  Anticoagulated on warfarin    Fall with head injury. Patient is on warfarin so INR will be checked. He is sent for CT of head and cervical spine. Old records reviewed and he was in the ED 2 weeks ago with a fall at which time he suffered a nasal fracture.  INR  has come back minimally above the therapeutic range and I do not feel it requires any adjustment in his warfarin dose. Laceration is closed with tissue adhesive. CT shows no intracranial injury. Risk of delayed bleed was explained to patient's wife who expressed understanding. He is discharged to return to his skilled nursing facility.  Delora Fuel, MD [phone] [id number]

## 2015-12-02 NOTE — ED Notes (Signed)
Brought in by EMS from [facility] facility with c/o head injury after his fall.  Per EMS, pt was observed getting out of his chair, lost balance and fell on the hardwood floor.  Pt sustained scalp laceration (top of head)---- bleeding controlled with pressure dressing.  Pt on Coumadin 5 mg daily.  Arrived to ED alert and awake with c-collar in place.

## 2015-12-02 NOTE — ED Notes (Signed)
Bed: [id number] Expected date:  Expected time:  Means of arrival:  Comments: EMS elderly fall. On coumadin

## 2015-12-03 DIAGNOSIS — S0101XA Laceration without foreign body of scalp, initial encounter: Secondary | ICD-10-CM | POA: Diagnosis not present

## 2015-12-03 NOTE — Discharge Instructions (Signed)
Fall Prevention in the Home  Falls can cause injuries and can affect people from all age groups. There are many simple things that you can do to make your home safe and to help prevent falls. WHAT CAN I DO ON THE OUTSIDE OF MY HOME?  Regularly repair the edges of walkways and driveways and fix any cracks.  Remove high doorway thresholds.  Trim any shrubbery on the main path into your home.  Use bright outdoor lighting.  Clear walkways of debris and clutter, including tools and rocks.  Regularly check that handrails are securely fastened and in good repair. Both sides of any steps should have handrails.  Install guardrails along the edges of any raised decks or porches.  Have leaves, snow, and ice cleared regularly.  Use sand or salt on walkways during winter months.  In the garage, clean up any spills right away, including grease or oil spills. WHAT CAN I DO IN THE BATHROOM?  Use night lights.  Install grab bars by the toilet and in the tub and shower. Do not use towel bars as grab bars.  Use non-skid mats or decals on the floor of the tub or shower.  If you need to sit down while you are in the shower, use a plastic, non-slip stool.Marland Kitchen  Keep the floor dry. Immediately clean up any water that spills on the floor.  Remove soap buildup in the tub or shower on a regular basis.  Attach bath mats securely with double-sided non-slip rug tape.  Remove throw rugs and other tripping hazards from the floor. WHAT CAN I DO IN THE BEDROOM?  Use night lights.  Make sure that a bedside light is easy to reach.  Do not use oversized bedding that drapes onto the floor.  Have a firm chair that has side arms to use for getting dressed.  Remove throw rugs and other tripping hazards from the floor. WHAT CAN I DO IN THE KITCHEN?   Clean up any spills right away.  Avoid walking on wet floors.  Place frequently used items in easy-to-reach places.  If you need to reach for something  above you, use a sturdy step stool that has a grab bar.  Keep electrical cables out of the way.  Do not use floor polish or wax that makes floors slippery. If you have to use wax, make sure that it is non-skid floor wax.  Remove throw rugs and other tripping hazards from the floor. WHAT CAN I DO IN THE STAIRWAYS?  Do not leave any items on the stairs.  Make sure that there are handrails on both sides of the stairs. Fix handrails that are broken or loose. Make sure that handrails are as long as the stairways.  Check any carpeting to make sure that it is firmly attached to the stairs. Fix any carpet that is loose or worn.  Avoid having throw rugs at the top or bottom of stairways, or secure the rugs with carpet tape to prevent them from moving.  Make sure that you have a light switch at the top of the stairs and the bottom of the stairs. If you do not have them, have them installed. WHAT ARE SOME OTHER FALL PREVENTION TIPS?  Wear closed-toe shoes that fit well and support your feet. Wear shoes that have rubber soles or low heels.  When you use a stepladder, make sure that it is completely opened and that the sides are firmly locked. Have someone hold the ladder while you  are using it. Do not climb a closed stepladder.  Add color or contrast paint or tape to grab bars and handrails in your home. Place contrasting color strips on the first and last steps.  Use mobility aids as needed, such as canes, walkers, scooters, and crutches.  Turn on lights if it is dark. Replace any light bulbs that burn out.  Set up furniture so that there are clear paths. Keep the furniture in the same spot.  Fix any uneven floor surfaces.  Choose a carpet design that does not hide the edge of steps of a stairway.  Be aware of any and all pets.  Review your medicines with your healthcare provider. Some medicines can cause dizziness or changes in blood pressure, which increase your risk of falling. Talk  with your health care provider about other ways that you can decrease your risk of falls. This may include working with a physical therapist or trainer to improve your strength, balance, and endurance.   This information is not intended to replace advice given to you by your health care provider. Make sure you discuss any questions you have with your health care provider.   Document Released: 11/30/2002 Document Revised: 04/26/2015 Document Reviewed: 01/14/2015 Elsevier Interactive Patient Education 2016 [place].  Tissue Adhesive Wound Care Some cuts, wounds, lacerations, and incisions can be repaired by using tissue adhesive. Tissue adhesive is like glue. It holds the skin together, allowing for faster healing. It forms a strong bond on the skin in about 1 minute and reaches its full strength in about 2 or 3 minutes. The adhesive disappears naturally while the wound is healing. It is important to take proper care of your wound at home while it heals.  HOME CARE INSTRUCTIONS   Showers are allowed. Do not soak the area containing the tissue adhesive. Do not take baths, swim, or use hot tubs. Do not use any soaps or ointments on the wound. Certain ointments can weaken the glue.  If a bandage (dressing) has been applied, follow your health care provider's instructions for how often to change the dressing.   Keep the dressing dry if one has been applied.   Do not scratch, pick, or rub the adhesive.   Do not place tape over the adhesive. The adhesive could come off when pulling the tape off.   Protect the wound from further injury until it is healed.   Protect the wound from sun and tanning bed exposure while it is healing and for several weeks after healing.   Only take over-the-counter or prescription medicines as directed by your health care provider.   Keep all follow-up appointments as directed by your health care provider. SEEK IMMEDIATE MEDICAL CARE IF:   Your wound  becomes red, swollen, hot, or tender.   You develop a rash after the glue is applied.  You have increasing pain in the wound.   You have a red streak that goes away from the wound.   You have pus coming from the wound.   You have increased bleeding.  You have a fever.  You have shaking chills.   You notice a bad smell coming from the wound.   Your wound or adhesive breaks open.  MAKE SURE YOU:   Understand these instructions.  Will watch your condition.  Will get help right away if you are not doing well or get worse.   This information is not intended to replace advice given to you by your health care  provider. Make sure you discuss any questions you have with your health care provider.   Document Released: 06/05/2001 Document Revised: 09/30/2013 Document Reviewed: 07/01/2013 Elsevier Interactive Patient Education [employer].

## 2016-01-16 ENCOUNTER — Encounter (HOSPITAL_COMMUNITY): Payer: Self-pay | Admitting: Emergency Medicine

## 2016-01-16 ENCOUNTER — Emergency Department (HOSPITAL_COMMUNITY): Payer: Medicare Other

## 2016-01-16 ENCOUNTER — Emergency Department (HOSPITAL_COMMUNITY)
Admission: EM | Admit: 2016-01-16 | Discharge: 2016-01-16 | Disposition: A | Discharge status: Home / Self Care | Payer: Medicare Other | Attending: Emergency Medicine | Admitting: Emergency Medicine

## 2016-01-16 DIAGNOSIS — F0280 Dementia in other diseases classified elsewhere without behavioral disturbance: Secondary | ICD-10-CM | POA: Diagnosis not present

## 2016-01-16 DIAGNOSIS — Z87442 Personal history of urinary calculi: Secondary | ICD-10-CM | POA: Diagnosis not present

## 2016-01-16 DIAGNOSIS — Z8546 Personal history of malignant neoplasm of prostate: Secondary | ICD-10-CM | POA: Insufficient documentation

## 2016-01-16 DIAGNOSIS — S42031A Displaced fracture of lateral end of right clavicle, initial encounter for closed fracture: Secondary | ICD-10-CM | POA: Diagnosis not present

## 2016-01-16 DIAGNOSIS — S0083XA Contusion of other part of head, initial encounter: Secondary | ICD-10-CM | POA: Diagnosis not present

## 2016-01-16 DIAGNOSIS — I482 Chronic atrial fibrillation, unspecified: Secondary | ICD-10-CM

## 2016-01-16 DIAGNOSIS — Z7901 Long term (current) use of anticoagulants: Secondary | ICD-10-CM | POA: Diagnosis not present

## 2016-01-16 DIAGNOSIS — Z86010 Personal history of colonic polyps: Secondary | ICD-10-CM | POA: Diagnosis not present

## 2016-01-16 DIAGNOSIS — R791 Abnormal coagulation profile: Secondary | ICD-10-CM | POA: Diagnosis not present

## 2016-01-16 DIAGNOSIS — W01198A Fall on same level from slipping, tripping and stumbling with subsequent striking against other object, initial encounter: Secondary | ICD-10-CM | POA: Insufficient documentation

## 2016-01-16 DIAGNOSIS — Y9301 Activity, walking, marching and hiking: Secondary | ICD-10-CM | POA: Diagnosis not present

## 2016-01-16 DIAGNOSIS — Z79899 Other long term (current) drug therapy: Secondary | ICD-10-CM | POA: Insufficient documentation

## 2016-01-16 DIAGNOSIS — I10 Essential (primary) hypertension: Secondary | ICD-10-CM | POA: Diagnosis not present

## 2016-01-16 DIAGNOSIS — G309 Alzheimer's disease, unspecified: Secondary | ICD-10-CM | POA: Insufficient documentation

## 2016-01-16 DIAGNOSIS — E785 Hyperlipidemia, unspecified: Secondary | ICD-10-CM | POA: Insufficient documentation

## 2016-01-16 DIAGNOSIS — Y998 Other external cause status: Secondary | ICD-10-CM | POA: Insufficient documentation

## 2016-01-16 DIAGNOSIS — W19XXXA Unspecified fall, initial encounter: Secondary | ICD-10-CM

## 2016-01-16 DIAGNOSIS — S0990XA Unspecified injury of head, initial encounter: Secondary | ICD-10-CM | POA: Diagnosis present

## 2016-01-16 DIAGNOSIS — Y92031 Bathroom in apartment as the place of occurrence of the external cause: Secondary | ICD-10-CM | POA: Insufficient documentation

## 2016-01-16 DIAGNOSIS — Y92129 Unspecified place in nursing home as the place of occurrence of the external cause: Secondary | ICD-10-CM

## 2016-01-16 LAB — PROTIME-INR
INR: 1.5 — AB (ref 0.00–1.49)
PROTHROMBIN TIME: 18.2 s — AB (ref 11.6–15.2)

## 2016-01-16 MED ORDER — OXYCODONE-ACETAMINOPHEN 5-325 MG PO TABS: 1.0000 | ORAL_TABLET | ORAL | Status: AC | PRN

## 2016-01-16 NOTE — Discharge Instructions (Signed)
Talk with your doctor about whether you should continue anticoagulants in light of frequent falls.  Wear the sling as needed for comfort.  Clavicle Fracture The clavicle, also called the collarbone, is the long bone that connects your shoulder to your rib cage. You can feel your collarbone at the top of your shoulders and rib cage. A clavicle fracture is a broken clavicle. It is a common injury that can happen at any age.  CAUSES Common causes of a clavicle fracture include:  A direct blow to your shoulder.  A car accident.  A fall, especially if you try to break your fall with an outstretched arm. RISK FACTORS You may be at increased risk if:  You are younger than 25 years or older than 38 years. Most clavicle fractures happen to people who are younger than 25 years.  You are a male.  You play contact sports. SIGNS AND SYMPTOMS A fractured clavicle is painful. It also makes it hard to move your arm. Other signs and symptoms may include:  A shoulder that drops downward and forward.  Pain when trying to lift your shoulder.  Bruising, swelling, and tenderness over your clavicle.  A grinding noise when you try to move your shoulder.  A bump over your clavicle. DIAGNOSIS Your health care provider can usually diagnose a clavicle fracture by asking about your injury and examining your shoulder and clavicle. He or she may take an X-ray to determine the position of your clavicle. TREATMENT Treatment depends on the position of your clavicle after the fracture:  If the broken ends of the bone are not out of place, your health care provider may put your arm in a sling or wrap a support bandage around your chest (figure-of-eight wrap).  If the broken ends of the bone are out of place, you may need surgery. Surgery may involve placing screws, pins, or plates to keep your clavicle stable while it heals. Healing may take about 3 months. When your health care provider thinks your fracture  has healed enough, you may have to do physical therapy to regain normal movement and build up your arm strength. HOME CARE INSTRUCTIONS   Apply ice to the injured area:  Put ice in a plastic bag.  Place a towel between your skin and the bag.  Leave the ice on for 20 minutes, 2-3 times a day.  If you have a wrap or splint:  Wear it all the time, and remove it only to take a bath or shower.  When you bathe or shower, keep your shoulder in the same position as when the sling or wrap is on.  Do not lift your arm.  If you have a figure-of-eight wrap:  Another person must tighten it every day.  It should be tight enough to hold your shoulders back.  Allow enough room to place your index finger between your body and the strap.  Loosen the wrap immediately if you feel numbness or tingling in your hands.  Only take medicines as directed by your health care provider.  Avoid activities that make the injury or pain worse for 4-6 weeks after surgery.  Keep all follow-up appointments. SEEK MEDICAL CARE IF:  Your medicine is not helping to relieve pain and swelling. SEEK IMMEDIATE MEDICAL CARE IF:  Your arm is numb, cold, or pale, even when the splint is loose. MAKE SURE YOU:   Understand these instructions.  Will watch your condition.  Will get help right away if you are  not doing well or get worse.   This information is not intended to replace advice given to you by your health care provider. Make sure you discuss any questions you have with your health care provider.   Document Released: 09/19/2005 Document Revised: 12/15/2013 Document Reviewed: 11/02/2013 Elsevier Interactive Patient Education 2016 [employer]  Acetaminophen; Oxycodone tablets What is this medicine? ACETAMINOPHEN; OXYCODONE (a set a MEE noe fen; ox i KOE done) is a pain reliever. It is used to treat moderate to severe pain. This medicine may be used for other purposes; ask your health care provider or  pharmacist if you have questions. What should I tell my health care provider before I take this medicine? They need to know if you have any of these conditions: -brain tumor -Crohn's disease, inflammatory bowel disease, or ulcerative colitis -drug abuse or addiction -head injury -heart or circulation problems -if you often drink alcohol -kidney disease or problems going to the bathroom -liver disease -lung disease, asthma, or breathing problems -an unusual or allergic reaction to acetaminophen, oxycodone, other opioid analgesics, other medicines, foods, dyes, or preservatives -pregnant or trying to get pregnant -breast-feeding How should I use this medicine? Take this medicine by mouth with a full glass of water. Follow the directions on the prescription label. You can take it with or without food. If it upsets your stomach, take it with food. Take your medicine at regular intervals. Do not take it more often than directed. Talk to your pediatrician regarding the use of this medicine in children. Special care may be needed. Patients over 51 years old may have a stronger reaction and need a smaller dose. Overdosage: If you think you have taken too much of this medicine contact a poison control center or emergency room at once. NOTE: This medicine is only for you. Do not share this medicine with others. What if I miss a dose? If you miss a dose, take it as soon as you can. If it is almost time for your next dose, take only that dose. Do not take double or extra doses. What may interact with this medicine? -alcohol -antihistamines -barbiturates like amobarbital, butalbital, butabarbital, methohexital, pentobarbital, phenobarbital, thiopental, and secobarbital -benztropine -drugs for bladder problems like solifenacin, trospium, oxybutynin, tolterodine, hyoscyamine, and methscopolamine -drugs for breathing problems like ipratropium and tiotropium -drugs for certain stomach or intestine  problems like propantheline, homatropine methylbromide, glycopyrrolate, atropine, belladonna, and dicyclomine -general anesthetics like etomidate, ketamine, nitrous oxide, propofol, desflurane, enflurane, halothane, isoflurane, and sevoflurane -medicines for depression, anxiety, or psychotic disturbances -medicines for sleep -muscle relaxants -naltrexone -narcotic medicines (opiates) for pain -phenothiazines like perphenazine, thioridazine, chlorpromazine, mesoridazine, fluphenazine, prochlorperazine, promazine, and trifluoperazine -scopolamine -tramadol -trihexyphenidyl This list may not describe all possible interactions. Give your health care provider a list of all the medicines, herbs, non-prescription drugs, or dietary supplements you use. Also tell them if you smoke, drink alcohol, or use illegal drugs. Some items may interact with your medicine. What should I watch for while using this medicine? Tell your doctor or health care professional if your pain does not go away, if it gets worse, or if you have new or a different type of pain. You may develop tolerance to the medicine. Tolerance means that you will need a higher dose of the medication for pain relief. Tolerance is normal and is expected if you take this medicine for a long time. Do not suddenly stop taking your medicine because you may develop a severe reaction. Your body becomes used  to the medicine. This does NOT mean you are addicted. Addiction is a behavior related to getting and using a drug for a non-medical reason. If you have pain, you have a medical reason to take pain medicine. Your doctor will tell you how much medicine to take. If your doctor wants you to stop the medicine, the dose will be slowly lowered over time to avoid any side effects. You may get drowsy or dizzy. Do not drive, use machinery, or do anything that needs mental alertness until you know how this medicine affects you. Do not stand or sit up quickly,  especially if you are an older patient. This reduces the risk of dizzy or fainting spells. Alcohol may interfere with the effect of this medicine. Avoid alcoholic drinks. There are different types of narcotic medicines (opiates) for pain. If you take more than one type at the same time, you may have more side effects. Give your health care provider a list of all medicines you use. Your doctor will tell you how much medicine to take. Do not take more medicine than directed. Call emergency for help if you have problems breathing. The medicine will cause constipation. Try to have a bowel movement at least every 2 to 3 days. If you do not have a bowel movement for 3 days, call your doctor or health care professional. Do not take Tylenol (acetaminophen) or medicines that have acetaminophen with this medicine. Too much acetaminophen can be very dangerous. Many nonprescription medicines contain acetaminophen. Always read the labels carefully to avoid taking more acetaminophen. What side effects may I notice from receiving this medicine? Side effects that you should report to your doctor or health care professional as soon as possible: -allergic reactions like skin rash, itching or hives, swelling of the face, lips, or tongue -breathing difficulties, wheezing -confusion -light headedness or fainting spells -severe stomach pain -unusually weak or tired -yellowing of the skin or the whites of the eyes Side effects that usually do not require medical attention (report to your doctor or health care professional if they continue or are bothersome): -dizziness -drowsiness -nausea -vomiting This list may not describe all possible side effects. Call your doctor for medical advice about side effects. You may report side effects to FDA at 1-800-FDA-1088. Where should I keep my medicine? Keep out of the reach of children. This medicine can be abused. Keep your medicine in a safe place to protect it from theft. Do  not share this medicine with anyone. Selling or giving away this medicine is dangerous and against the law. This medicine may cause accidental overdose and death if it taken by other adults, children, or pets. Mix any unused medicine with a substance like cat litter or coffee grounds. Then throw the medicine away in a sealed container like a sealed bag or a coffee can with a lid. Do not use the medicine after the expiration date. Store at room temperature between 20 and 25 degrees C (68 and 77 degrees F). NOTE: This sheet is a summary. It may not cover all possible information. If you have questions about this medicine, talk to your doctor, pharmacist, or health care provider.    2016, Elsevier/Gold Standard. (2014-11-10 15:18:46)

## 2016-01-16 NOTE — ED Provider Notes (Signed)
CSN: [id number]     Arrival date & time 01/16/16  0557 History   First MD Initiated Contact with Patient 01/16/16 0602     Chief Complaint  Patient presents with  . Fall     (Consider location/radiation/quality/duration/timing/severity/associated sxs/prior Treatment) Patient is a 82 y.o. male presenting with fall. The history is provided by the nursing home and a relative. The history is limited by the condition of the patient (Dementia).  Fall  He is a resident had a memory unit and fell while walking to the bathroom. He suffered a bruise to his right forehead and injured his right shoulder. Patient is unable to give any history. Of note, he is anticoagulated on warfarin causes of chronic atrial fibrillation. His daughter states this is his third fall in the last several months.  Past Medical History  Diagnosis Date  . A-fib ([place])   . Hypertension   . Hyperlipidemia   . Adenomatous colon polyp   . Hemorrhoids   . Inguinal hernia   . Umbilical hernia   . Colitis   . Diverticulitis   . Rectal bleeding   . Arthritis   . Alzheimer disease   . Nephrolithiasis   . Prostate cancer [facility])    Past Surgical History  Procedure Laterality Date  . Appendectomy    . Joint replacement    . Replacement total knee bilateral Bilateral   . Umbilical hernia repair    . Infected mesh after umbilical hernia repair    . Rotator cuff repair Right   . Insertion prostate radiation seed     History reviewed. No pertinent family history. Social History  Substance Use Topics  . Smoking status: Never Smoker   . Smokeless tobacco: Never Used  . Alcohol Use: Yes    Review of Systems  Unable to perform ROS: Dementia      Allergies  Erythromycin  Home Medications   Prior to Admission medications   Medication Sig Start Date End Date Taking? Authorizing Provider  acetaminophen (TYLENOL) 500 MG tablet Take 1 tablet (500 mg total) by mouth every 6 (six) hours as needed for mild pain,  moderate pain or headache. 11/16/15   Domenic Moras, PA-C  atenolol (TENORMIN) 25 MG tablet Take 25 mg by mouth daily.    Historical Provider, MD  cephALEXin (KEFLEX) 500 MG capsule Take 1 capsule (500 mg total) by mouth 4 (four) times daily. Patient not taking: Reported on 12/02/2015 11/16/15   Domenic Moras, PA-C  DIGOXIN PO Take 0.25 mg by mouth daily.     Historical Provider, MD  GALANTAMINE HYDROBROMIDE PO Take 16 mg by mouth daily.     Historical Provider, MD  memantine (NAMENDA) 10 MG tablet Take 10 mg by mouth daily.     Historical Provider, MD  Multiple Vitamins-Minerals (MULTIVITAMIN WITH MINERALS) tablet Take 1 tablet by mouth daily.    Historical Provider, MD  neomycin-bacitracin-polymyxin (NEOSPORIN) ointment Apply 1 application topically every 12 (twelve) hours. Behind the right ear    Historical Provider, MD  traZODone (DESYREL) 50 MG tablet Take 50 mg by mouth at bedtime.    Historical Provider, MD  warfarin (COUMADIN) 5 MG tablet Take 5 mg by mouth daily. At [place] Provider, MD   BP 165/87 mmHg  Pulse 59  Temp(Src) 97.9 F (36.6 C) (Oral)  Resp 19  SpO2 98% Physical Exam  Nursing note and vitals reviewed.  82 year old male, resting comfortably and in no acute distress. Vital signs  are significant for hypertension and borderline bradycardia. Oxygen saturation is 98%, which is normal. Head is normocephalic. Right for head hematoma is present. PERRLA, EOMI. Oropharynx is clear. Neck is nontender without adenopathy or JVD. Back is nontender and there is no CVA tenderness. Lungs are clear without rales, wheezes, or rhonchi. Chest is nontender. Heart has an irregular rhythm with 2/6 holosystolic murmur heard throughout the precordium. Abdomen is soft, flat, nontender without masses or hepatosplenomegaly and peristalsis is normoactive. Extremities: There is tenderness to palpation of the right shoulder and pain on passive range of motion. No significant swelling. No other  extremity injuries seen. Chronic venous stasis changes are present on both lower legs. Skin is warm and dry without rash. Neurologic: He is awake and alert but noncommunicative, cranial nerves are intact, there are no gross motor or sensory deficits.  ED Course  Procedures (including critical care time) Labs Review Results for orders placed or performed during the hospital encounter of 01/16/16  Protime-INR  Result Value Ref Range   Prothrombin Time 18.2 (H) 11.6 - 15.2 seconds   INR 1.50 (H) 0.00 - 1.49    Imaging Review Dg Shoulder Right  01/16/2016  CLINICAL DATA:  Fall last night.  Right shoulder pain. EXAM: RIGHT SHOULDER - 2+ VIEW COMPARISON:  None. FINDINGS: There is a comminuted acute fracture through the distal right clavicle. Irregular bone fragment at the [facility] joint appears at least partially corticated and may be chronic related to old injury or degenerative changes. Degenerative changes within the right AC and glenohumeral joints. No proximal humeral acute abnormality. IMPRESSION: Comminuted acute fracture through the distal right clavicle. Degenerative changes in the right shoulder. Electronically Signed   By: Rolm Baptise M.D.   On: 01/16/2016 07:17   Ct Head Wo Contrast  01/16/2016  CLINICAL DATA:  Pain following fall.  History of dementia. EXAM: CT HEAD WITHOUT CONTRAST CT CERVICAL SPINE WITHOUT CONTRAST TECHNIQUE: Multidetector CT imaging of the head and cervical spine was performed following the standard protocol without intravenous contrast. Multiplanar CT image reconstructions of the cervical spine were also generated. COMPARISON:  CT head and CT cervical spine December 02, 2015 FINDINGS: CT HEAD FINDINGS Moderate diffuse atrophy is stable. There is no intracranial mass, hemorrhage, extra-axial fluid collection, or midline shift. There is patchy small vessel disease in the centra semiovale bilaterally, stable. There is patchy small vessel disease in the upper pons in the basilar  perforator distribution, stable. There is no new gray-white compartment lesion. No acute infarct evident. There is a right frontal scalp hematoma. The bony calvarium appears intact. The mastoid air cells are clear. No intraorbital lesions are evident. There is essentially complete opacification of the right maxillary antrum. There are retention cysts in the inferior left maxillary antrum. There is mucosal thickening several ethmoid air cells bilaterally. There is calcification in the cavernous portions of each carotid artery as well as in the distal vertebral arteries bilaterally. CT CERVICAL SPINE FINDINGS There is no demonstrable acute fracture or spondylolisthesis. There is an old injury to the superior anterior endplate of the T2 vertebral body. Prevertebral soft tissues and predental space regions are normal. There is moderately severe disc space narrowing at C4-5 and C6-7. There is moderate narrowing at C7-T1 and T1-2. There is facet hypertrophy at most levels bilaterally. There is rather marked exit foraminal narrowing on the left at C3-4 and at C4-5 and C5-6 bilaterally. Fairly severe neck exit foraminal narrowing is noted at C6-7 on the left. There is no  frank disc extrusion or high-grade stenosis. There is calcification in each carotid artery. IMPRESSION: CT head: Stable atrophy with supratentorial and infratentorial small vessel disease. No intracranial mass, hemorrhage, or extra-axial fluid collection. No acute appearing infarct evident. Multifocal paranasal sinus disease is noted. There is a right frontal scalp hematoma without demonstrable calvarial fracture. CT cervical spine: Extensive multilevel osteoarthritic change/spondylosis. Evidence of an old fracture of the superior anterior endplate of the T2 vertebral body. No acute fracture or spondylolisthesis. There is carotid artery calcification bilaterally. Electronically Signed   By: Lowella Grip III M.D.   On: 01/16/2016 07:53   Ct Cervical  Spine Wo Contrast  01/16/2016  CLINICAL DATA:  Pain following fall.  History of dementia. EXAM: CT HEAD WITHOUT CONTRAST CT CERVICAL SPINE WITHOUT CONTRAST TECHNIQUE: Multidetector CT imaging of the head and cervical spine was performed following the standard protocol without intravenous contrast. Multiplanar CT image reconstructions of the cervical spine were also generated. COMPARISON:  CT head and CT cervical spine December 02, 2015 FINDINGS: CT HEAD FINDINGS Moderate diffuse atrophy is stable. There is no intracranial mass, hemorrhage, extra-axial fluid collection, or midline shift. There is patchy small vessel disease in the centra semiovale bilaterally, stable. There is patchy small vessel disease in the upper pons in the basilar perforator distribution, stable. There is no new gray-white compartment lesion. No acute infarct evident. There is a right frontal scalp hematoma. The bony calvarium appears intact. The mastoid air cells are clear. No intraorbital lesions are evident. There is essentially complete opacification of the right maxillary antrum. There are retention cysts in the inferior left maxillary antrum. There is mucosal thickening several ethmoid air cells bilaterally. There is calcification in the cavernous portions of each carotid artery as well as in the distal vertebral arteries bilaterally. CT CERVICAL SPINE FINDINGS There is no demonstrable acute fracture or spondylolisthesis. There is an old injury to the superior anterior endplate of the T2 vertebral body. Prevertebral soft tissues and predental space regions are normal. There is moderately severe disc space narrowing at C4-5 and C6-7. There is moderate narrowing at C7-T1 and T1-2. There is facet hypertrophy at most levels bilaterally. There is rather marked exit foraminal narrowing on the left at C3-4 and at C4-5 and C5-6 bilaterally. Fairly severe neck exit foraminal narrowing is noted at C6-7 on the left. There is no frank disc extrusion  or high-grade stenosis. There is calcification in each carotid artery. IMPRESSION: CT head: Stable atrophy with supratentorial and infratentorial small vessel disease. No intracranial mass, hemorrhage, or extra-axial fluid collection. No acute appearing infarct evident. Multifocal paranasal sinus disease is noted. There is a right frontal scalp hematoma without demonstrable calvarial fracture. CT cervical spine: Extensive multilevel osteoarthritic change/spondylosis. Evidence of an old fracture of the superior anterior endplate of the T2 vertebral body. No acute fracture or spondylolisthesis. There is carotid artery calcification bilaterally. Electronically Signed   By: Lowella Grip III M.D.   On: 01/16/2016 07:53   I have personally reviewed and evaluated these images and lab results as part of my medical decision-making.   EKG Interpretation   Date/Time:  Monday January 16 2016 06:01:25 EST Ventricular Rate:  66 PR Interval:    QRS Duration: 97 QT Interval:  396 QTC Calculation: 415 R Axis:   -49 Text Interpretation:  Atrial fibrillation LAD, consider left anterior  fascicular block Nonspecific repol abnormality, diffuse leads When  compared with ECG of 04/18/2001, HEART RATE has decreased Confirmed by  Roxanne Mins  MD, Margrit Minner (  [id number]) on 01/16/2016 6:14:07 AM      MDM   Final diagnoses:  Fall at nursing home, initial encounter  Closed fracture of distal clavicle, right, initial encounter  Traumatic hematoma of forehead, initial encounter  Subtherapeutic international normalized ratio (INR)  Atrial fibrillation, chronic ([place])    Fall with forehead hematoma and injury to right shoulder. I am concerned about his being on warfarin with multiple falls. On review of past records, this is his third ED visit for a fall in a 2 month span. I have discussed with his daughter the risks of being anticoagulated with frequent falls and have recommended that she discuss the merits of taking him off of  anticoagulants with PCP.  X-ray shows fracture of the distal clavicle. He is placed in a sling for comfort. No intracranial injury is identified. He is referred to orthopedics for follow-up. He is given prescription for oxycodone have acetaminophen for pain. Need for discussion regarding whether or not he should be anticoagulated was discussed again.  Delora Fuel, MD [phone]

## 2016-01-16 NOTE — ED Notes (Signed)
Per EMS, patient is resident at nursing home. Patient tried to ambulate to the restroom this morning and fell, hitting his head and sustaining a right shoulder injury too. Pt has dementia and does not recall falling.

## 2016-01-16 NOTE — ED Notes (Signed)
Patient transported to X-ray 

## 2016-01-16 NOTE — ED Notes (Signed)
[facility] called to transport pt 

## 2016-03-24 DEATH — deceased

## 2016-08-07 IMAGING — CT CT CERVICAL SPINE W/O CM
4 of 6 series · 13 of 33 positions shown, 15 images · non-contrast
Comparison: CT of the head and cervical spine performed 11/16/2015

CLINICAL DATA: Status post fall, with head injury. Lost balance
while getting out of chair, and fell on hardwood floor. Scalp
laceration at the top of the head. Patient on Coumadin. Concern for
cervical spine injury. Initial encounter.

EXAM:
CT HEAD WITHOUT CONTRAST
CT CERVICAL SPINE WITHOUT CONTRAST
TECHNIQUE: Multidetector CT imaging of the head and cervical spine was
performed following the standard protocol without intravenous
contrast. Multiplanar CT image reconstructions of the cervical spine
were also generated.

[Series 5: c-spine st · axial · 0.25mm/px · z∈[-269,-227]mm · 2 of 64 slices shown]
[im 22/64  bone]
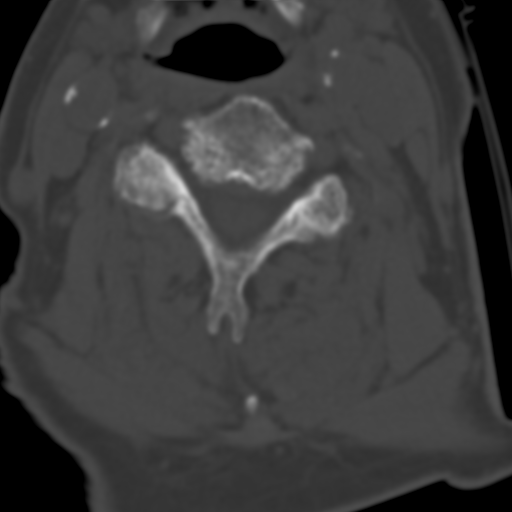
[im 43/64  bone]
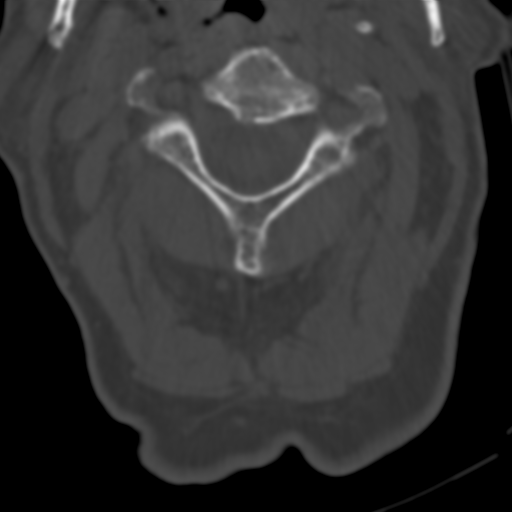

[Series 8: axial recon · axial · 0.23mm/px · z∈[-306,-237]mm · 3 of 74 slices shown, 4 images]
[im 19/74  soft-tissue]
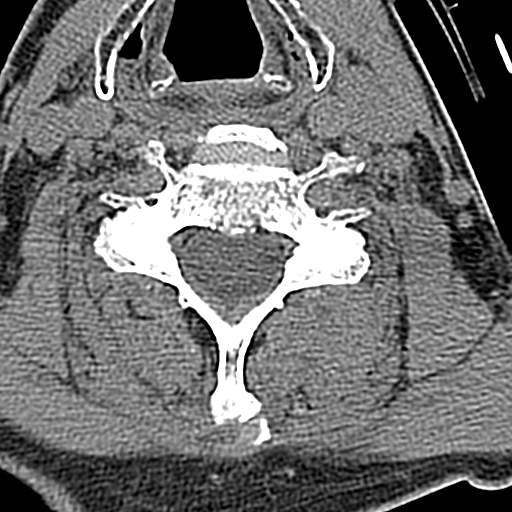
[im 19/74  bone]
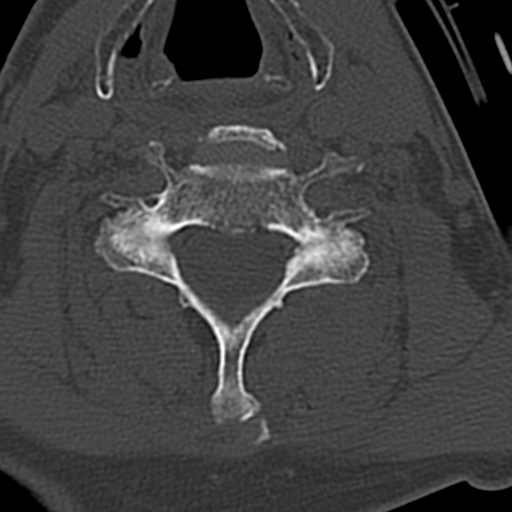
[im 37/74  bone]
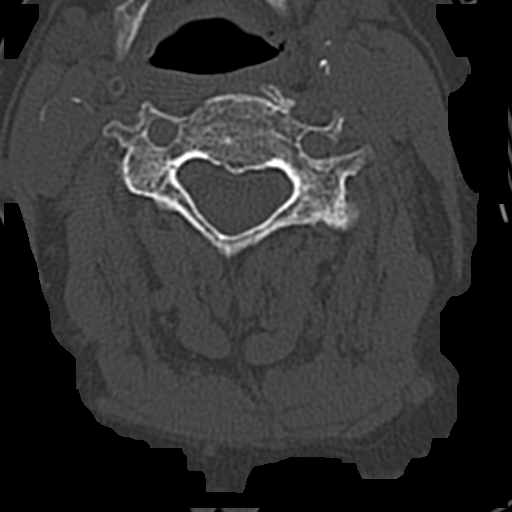
[im 55/74  bone]
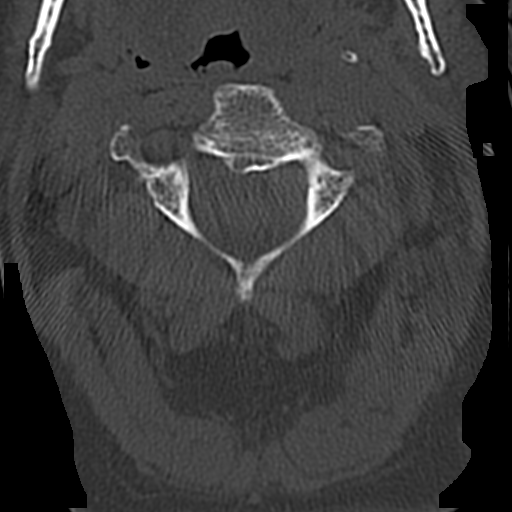

[Series 9: coronal · coronal · 0.23mm/px · 3 of 56 slices shown]
[im 12/56  bone]
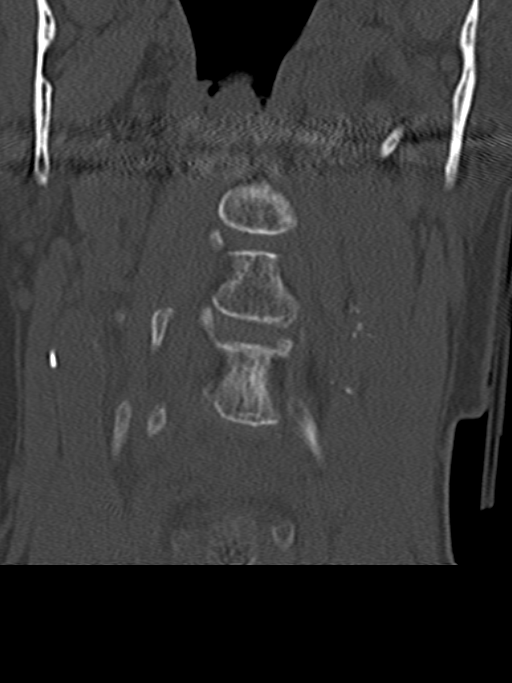
[im 23/56  bone]
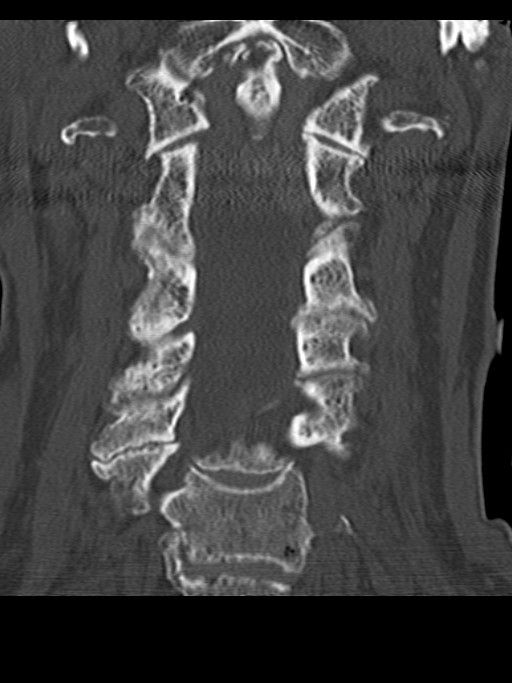
[im 34/56  bone]
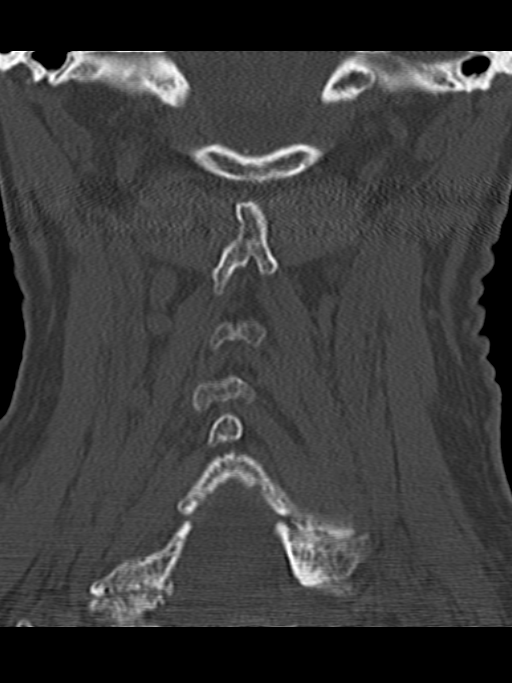

[Series 10: sagittal · sagittal · 0.26mm/px · 5 of 61 slices shown, 6 images]
[im 21/61  bone]
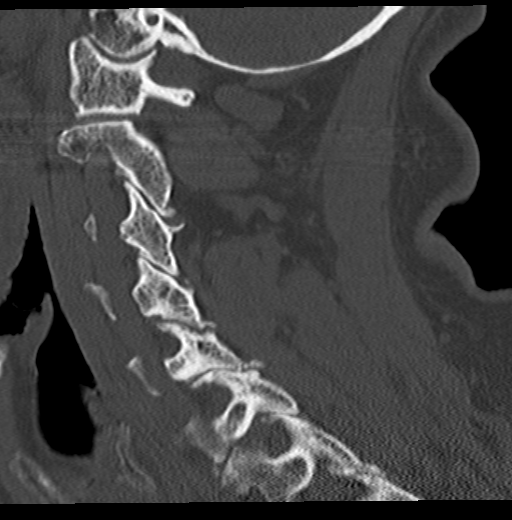
[im 26/61  bone]
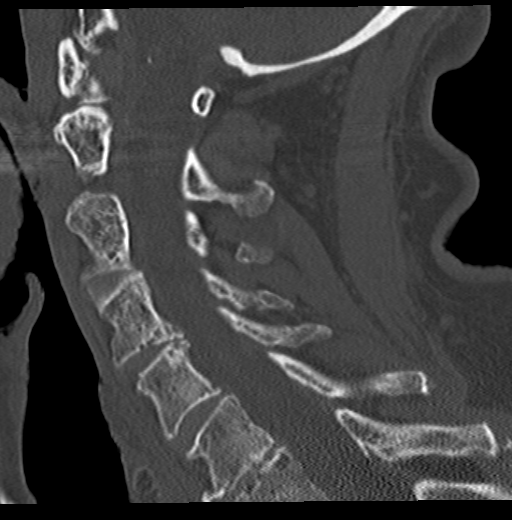
[im 31/61  soft-tissue]
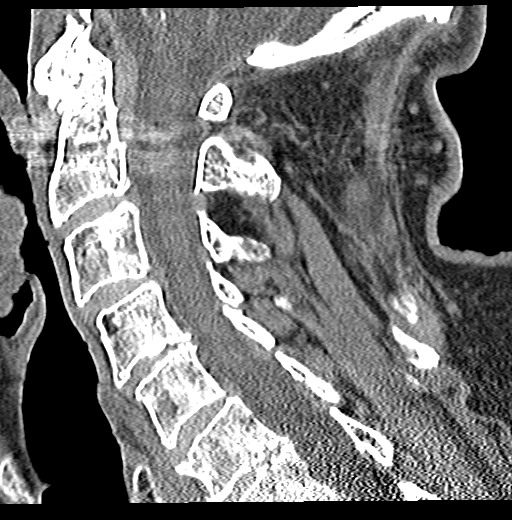
[im 31/61  bone]
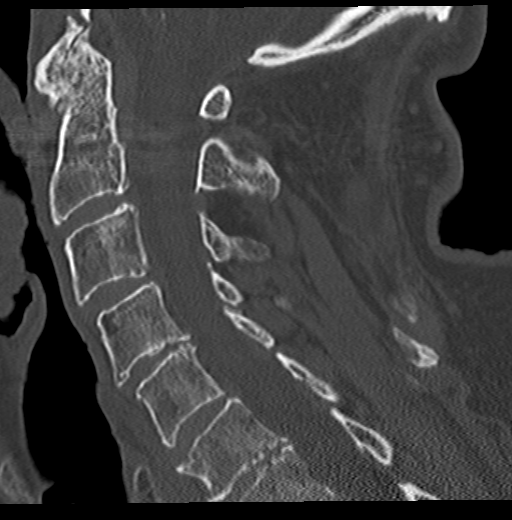
[im 36/61  bone]
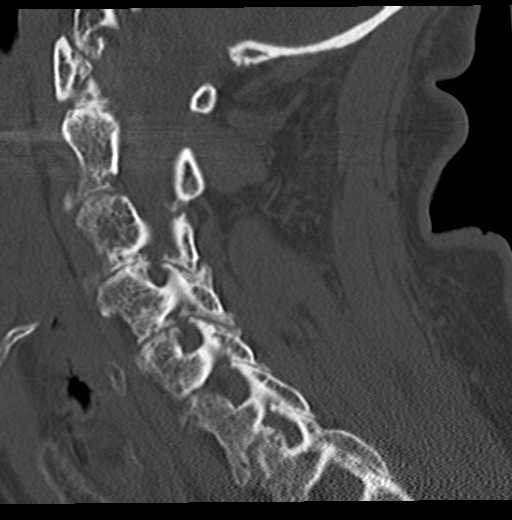
[im 41/61  bone]
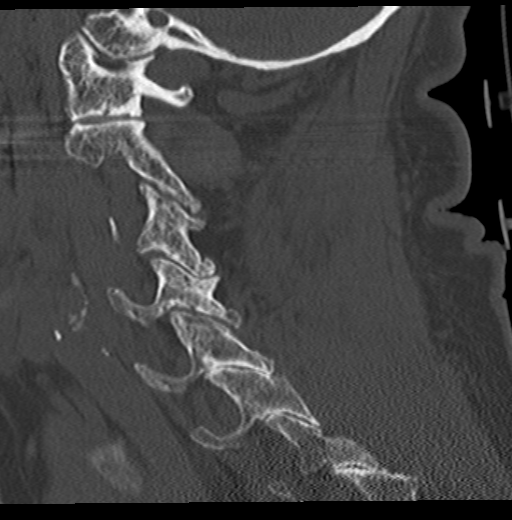

[13 of 33 positions shown; findings below may reference images not displayed]

FINDINGS: CT HEAD FINDINGS

There is no evidence of acute infarction, mass lesion, or intra- or
extra-axial hemorrhage on CT.

Prominence of the ventricles and sulci reflects moderate cortical
volume loss. Cerebellar atrophy is noted. Scattered periventricular
and subcortical white matter change likely reflects small vessel
ischemic microangiopathy.

The brainstem and fourth ventricle are within normal limits. The
basal ganglia are unremarkable in appearance. The cerebral
hemispheres demonstrate grossly normal gray-white differentiation.
No mass effect or midline shift is seen.

There is no evidence of fracture; visualized osseous structures are
unremarkable in appearance. The orbits are within normal limits.
There is near complete opacification of the right maxillary sinus.
There is mild partial opacification of the right mastoid air cells.
The remaining paranasal sinuses and left mastoid air cells are
well-aerated. Soft tissue swelling and laceration are noted
overlying the right frontal calvarium.

CT CERVICAL SPINE FINDINGS

There is no evidence of fracture or subluxation. Vertebral body C7
is not fully imaged on this study. Facet disease is noted along the
cervical spine. There is mild disc space narrowing at C4-C5 and
C6-C7, and scattered anterior and posterior disc osteophyte
complexes are noted along the cervical spine. Vertebral bodies
demonstrate normal height and alignment. Prevertebral soft tissues
are within normal limits.

Scattered calcification is noted at the carotid bifurcations
bilaterally.
IMPRESSION: 1. No evidence of traumatic intracranial injury or fracture.
2. No evidence of fracture or subluxation along the cervical spine.
3. Soft tissue swelling and laceration overlying the right frontal
calvarium.
4. Moderate cortical volume loss and scattered small vessel ischemic
microangiopathy.
5. Mild degenerative change noted along the cervical spine.
6. Near complete opacification of the right maxillary sinus, and
mild partial opacification of the right mastoid air cells.
7. Scattered calcification at the carotid bifurcations bilaterally.
Carotid ultrasound could be considered for further evaluation, when
and as deemed clinically appropriate.
